# Patient Record
Sex: Male | Born: 1935
Health system: Southern US, Community
[De-identification: ages and names within clinical notes are randomized; demographics above are authoritative.]

## PROBLEM LIST (undated history)

## (undated) DIAGNOSIS — M1990 Unspecified osteoarthritis, unspecified site: Secondary | ICD-10-CM

## (undated) DIAGNOSIS — H3530 Unspecified macular degeneration: Secondary | ICD-10-CM

## (undated) DIAGNOSIS — I10 Essential (primary) hypertension: Secondary | ICD-10-CM

## (undated) DIAGNOSIS — C801 Malignant (primary) neoplasm, unspecified: Secondary | ICD-10-CM

## (undated) HISTORY — DX: Unspecified macular degeneration: H35.30

## (undated) HISTORY — PX: PROSTATE SURGERY: SHX751

## (undated) HISTORY — DX: Essential (primary) hypertension: I10

## (undated) HISTORY — DX: Malignant (primary) neoplasm, unspecified: C80.1

## (undated) HISTORY — DX: Unspecified osteoarthritis, unspecified site: M19.90

---

## 2011-06-16 DIAGNOSIS — C801 Malignant (primary) neoplasm, unspecified: Secondary | ICD-10-CM

## 2011-06-16 HISTORY — DX: Malignant (primary) neoplasm, unspecified: C80.1

## 2012-10-26 DIAGNOSIS — H353290 Exudative age-related macular degeneration, unspecified eye, stage unspecified: Secondary | ICD-10-CM | POA: Insufficient documentation

## 2013-08-09 DIAGNOSIS — C61 Malignant neoplasm of prostate: Secondary | ICD-10-CM | POA: Insufficient documentation

## 2014-07-04 ENCOUNTER — Other Ambulatory Visit: Payer: Self-pay | Admitting: *Deleted

## 2014-07-04 MED ORDER — CARVEDILOL 12.5 MG PO TABS
12.5000 mg | ORAL_TABLET | Freq: Two times a day (BID) | ORAL | Status: DC
Start: 2014-07-04 — End: 2014-09-24

## 2014-07-04 NOTE — Telephone Encounter (Signed)
Pt requesting refill on Carvedilol 12.5 mg, has appt scheduled with you 08/06/14, if approved will go electronically to [facility] in [employer] [employer].

## 2014-08-06 ENCOUNTER — Encounter: Payer: Self-pay | Admitting: Family Medicine

## 2014-09-04 ENCOUNTER — Ambulatory Visit (INDEPENDENT_AMBULATORY_CARE_PROVIDER_SITE_OTHER): Payer: Medicare Other | Admitting: Family Medicine

## 2014-09-04 ENCOUNTER — Encounter: Payer: Self-pay | Admitting: Family Medicine

## 2014-09-04 ENCOUNTER — Encounter (INDEPENDENT_AMBULATORY_CARE_PROVIDER_SITE_OTHER): Payer: Self-pay

## 2014-09-04 VITALS — BP 152/80 | HR 83 | Temp 97.2°F | Ht 66.5 in | Wt 183.8 lb

## 2014-09-04 DIAGNOSIS — E785 Hyperlipidemia, unspecified: Secondary | ICD-10-CM | POA: Diagnosis not present

## 2014-09-04 DIAGNOSIS — R5383 Other fatigue: Secondary | ICD-10-CM

## 2014-09-04 DIAGNOSIS — Z0000 Encounter for general adult medical examination without abnormal findings: Secondary | ICD-10-CM

## 2014-09-04 DIAGNOSIS — I10 Essential (primary) hypertension: Secondary | ICD-10-CM | POA: Diagnosis not present

## 2014-09-04 DIAGNOSIS — Z23 Encounter for immunization: Secondary | ICD-10-CM

## 2014-09-04 DIAGNOSIS — C61 Malignant neoplasm of prostate: Secondary | ICD-10-CM | POA: Diagnosis not present

## 2014-09-04 LAB — POCT CBC
Granulocyte percent: 72.4 %G (ref 37–80)
HCT, POC: 52 % (ref 43.5–53.7)
Hemoglobin: 16.5 g/dL (ref 14.1–18.1)
LYMPH, POC: 2.1 (ref 0.6–3.4)
MCH: 29.7 pg (ref 27–31.2)
MCHC: 31.8 g/dL (ref 31.8–35.4)
MCV: 93.6 fL (ref 80–97)
MPV: 7.6 fL (ref 0–99.8)
PLATELET COUNT, POC: 242 10*3/uL (ref 142–424)
POC GRANULOCYTE: 7 — AB (ref 2–6.9)
POC LYMPH %: 21.6 % (ref 10–50)
RBC: 5.56 M/uL (ref 4.69–6.13)
RDW, POC: 13.3 %
WBC: 9.6 10*3/uL (ref 4.6–10.2)

## 2014-09-04 MED ORDER — AMLODIPINE BESYLATE 5 MG PO TABS: 5.0000 mg | ORAL_TABLET | Freq: Every day | ORAL | Status: DC

## 2014-09-04 NOTE — Progress Notes (Signed)
Subjective:  Patient ID: Aaron Whitney, male    DOB: 1935/11/28  Age: 79 y.o. MRN: [id number]  CC: Annual Exam   HPI Aaron Whitney presents for CPE. Using 1/2 amlodipine. BP at home 128/74 this AM is typical.   follow-up of hypertension. Patient has no history of headache chest pain or shortness of breath or recent cough. Patient also denies symptoms of TIA such as numbness weakness lateralizing. Patient checks  blood pressure at home and has not had any elevated readings recently. Patient denies side effects from his medication.   Patient in for follow-up of elevated cholesterol. To date patient has declined medication. Denies side effects of statin he simply does not want to take them because of the possibility. He has tried various herbal preparations over the years.  He remains active walking and participating in multiple activities of moderate strenuous nature. He follows a careful diet with good balance of vegetables vitamins etc.  He is being followed by his urologist for prostate cancer but is not currently under treatment. They're simply monitoring his PSA twice annually.   History Aaron Whitney has a past medical history of Hypertension; Cancer (2013); and Macular degeneration.   He has no past surgical history on file.   His family history includes Alcohol abuse in his father; Cancer in his mother.He reports that he has never smoked. He does not have any smokeless tobacco history on file. He reports that he does not drink alcohol or use illicit drugs.  Current Outpatient Prescriptions on File Prior to Visit  Medication Sig Dispense Refill  . carvedilol (COREG) 12.5 MG tablet Take 1 tablet (12.5 mg total) by mouth 2 (two) times daily with a meal. 180 tablet 0   No current facility-administered medications on file prior to visit.    ROS Review of Systems  Constitutional: Negative for fever, chills, diaphoresis, activity change, appetite change, fatigue and  unexpected weight change.  HENT: Negative for congestion, ear pain, hearing loss, postnasal drip, rhinorrhea, sore throat, tinnitus and trouble swallowing.   Eyes: Negative for photophobia, pain, discharge and redness.  Respiratory: Negative for apnea, cough, choking, chest tightness, shortness of breath, wheezing and stridor.   Cardiovascular: Negative for chest pain, palpitations and leg swelling.  Gastrointestinal: Negative for nausea, vomiting, abdominal pain, diarrhea, constipation, blood in stool and abdominal distention.  Endocrine: Negative for cold intolerance, heat intolerance, polydipsia, polyphagia and polyuria.  Genitourinary: Negative for dysuria, urgency, frequency, hematuria, flank pain, enuresis, difficulty urinating and genital sores.  Musculoskeletal: Negative for joint swelling and arthralgias.  Skin: Negative for color change, rash and wound.  Allergic/Immunologic: Negative for immunocompromised state.  Neurological: Negative for dizziness, tremors, seizures, syncope, facial asymmetry, speech difficulty, weakness, light-headedness, numbness and headaches.  Hematological: Does not bruise/bleed easily.  Psychiatric/Behavioral: Negative for suicidal ideas, hallucinations, behavioral problems, confusion, sleep disturbance, dysphoric mood, decreased concentration and agitation. The patient is not nervous/anxious and is not hyperactive.     Objective:  BP 152/80 mmHg  Pulse 83  Temp(Src) 97.2 F (36.2 C) (Oral)  Ht 5' 6.5" (1.689 m)  Wt 183 lb 12.8 oz (83.371 kg)  BMI 29.23 kg/m2  Physical Exam  Constitutional: He is oriented to person, place, and time. He appears well-developed and well-nourished.  HENT:  Head: Normocephalic and atraumatic.  Mouth/Throat: Oropharynx is clear and moist.  Eyes: EOM are normal. Pupils are equal, round, and reactive to light.  Neck: Normal range of motion. No tracheal deviation present. No thyromegaly present.  Cardiovascular: Normal  rate,  regular rhythm and normal heart sounds.  Exam reveals no gallop and no friction rub.   No murmur heard. Pulmonary/Chest: Breath sounds normal. He has no wheezes. He has no rales.  Abdominal: Soft. He exhibits no mass. There is no tenderness.  Musculoskeletal: Normal range of motion. He exhibits no edema.  Neurological: He is alert and oriented to person, place, and time.  Skin: Skin is warm and dry.  Psychiatric: He has a normal mood and affect.    Assessment & Plan:   Aaron Whitney was seen today for annual exam.  Diagnoses and all orders for this visit:  Cancer of prostate with low recurrence risk (stage T1-2a and Gleason < 7 and PSA < 10) Orders: -     PSA, total and free -     Testosterone,Free and Total -     Fecal occult blood, imunochemical  Wellness examination Orders: -     POCT CBC -     CMP14+EGFR -     NMR, lipoprofile -     PSA, total and free -     Thyroid Panel With TSH -     Vit D  25 hydroxy (rtn osteoporosis monitoring) -     Fecal occult blood, imunochemical  Other fatigue Orders: -     POCT CBC -     Thyroid Panel With TSH -     Vit D  25 hydroxy (rtn osteoporosis monitoring)  Hyperlipemia Orders: -     NMR, lipoprofile  Essential hypertension Orders: -     POCT CBC -     CMP14+EGFR -     Fecal occult blood, imunochemical  Other orders -     amLODipine (NORVASC) 5 MG tablet; Take 1 tablet (5 mg total) by mouth daily. -     Pneumococcal conjugate vaccine 13-valent IM   I have changed Mr. Aaron Whitney's amLODipine. I am also having him maintain his carvedilol and hyoscyamine.  Meds ordered this encounter  Medications  . DISCONTD: amLODipine (NORVASC) 10 MG tablet    Sig: Take 5 mg by mouth daily.  . hyoscyamine (LEVSIN SL) 0.125 MG SL tablet    Sig: 1-2 under tongue every four hours as needed for abdominal cramps, bloating or pain  . amLODipine (NORVASC) 5 MG tablet    Sig: Take 1 tablet (5 mg total) by mouth daily.    Dispense:  90 tablet     Refill:  3     Follow-up: Return in about 6 months (around 03/07/2015).  Claretta Fraise, M.D.

## 2014-09-04 NOTE — Patient Instructions (Signed)
Continue meds as discussed

## 2014-09-06 LAB — THYROID PANEL WITH TSH
Free Thyroxine Index: 3 (ref 1.2–4.9)
T3 UPTAKE RATIO: 31 % (ref 24–39)
T4 TOTAL: 9.6 ug/dL (ref 4.5–12.0)
TSH: 2.17 u[IU]/mL (ref 0.450–4.500)

## 2014-09-06 LAB — PSA, TOTAL AND FREE
PSA FREE: 0.15 ng/mL
PSA, Free Pct: 15 %
PSA: 1 ng/mL (ref 0.0–4.0)

## 2014-09-06 LAB — CMP14+EGFR
ALT: 20 IU/L (ref 0–44)
AST: 26 IU/L (ref 0–40)
Albumin/Globulin Ratio: 1.8 (ref 1.1–2.5)
Albumin: 4.8 g/dL (ref 3.5–4.8)
Alkaline Phosphatase: 68 IU/L (ref 39–117)
BUN/Creatinine Ratio: 19 (ref 10–22)
BUN: 20 mg/dL (ref 8–27)
Bilirubin Total: 0.8 mg/dL (ref 0.0–1.2)
CHLORIDE: 96 mmol/L — AB (ref 97–108)
CO2: 24 mmol/L (ref 18–29)
Calcium: 10.1 mg/dL (ref 8.6–10.2)
Creatinine, Ser: 1.07 mg/dL (ref 0.76–1.27)
GFR calc non Af Amer: 67 mL/min/{1.73_m2} (ref >59)
GFR, EST AFRICAN AMERICAN: 78 mL/min/{1.73_m2} (ref >59)
GLUCOSE: 108 mg/dL — AB (ref 65–99)
Globulin, Total: 2.7 g/dL (ref 1.5–4.5)
Potassium: 4.9 mmol/L (ref 3.5–5.2)
Sodium: 139 mmol/L (ref 134–144)
TOTAL PROTEIN: 7.5 g/dL (ref 6.0–8.5)

## 2014-09-06 LAB — NMR, LIPOPROFILE
CHOLESTEROL: 223 mg/dL — AB (ref 100–199)
HDL Cholesterol by NMR: 49 mg/dL (ref >39)
HDL Particle Number: 28 umol/L — ABNORMAL LOW (ref >=30.5)
LDL Particle Number: 2222 nmol/L — ABNORMAL HIGH (ref <1000)
LDL SIZE: 20.5 nm (ref >20.5)
LDL-C: 159 mg/dL — AB (ref 0–99)
LP-IR SCORE: 25 (ref <=45)
Small LDL Particle Number: 954 nmol/L — ABNORMAL HIGH (ref <=527)
Triglycerides by NMR: 76 mg/dL (ref 0–149)

## 2014-09-06 LAB — TESTOSTERONE,FREE AND TOTAL
TESTOSTERONE FREE: 6.2 pg/mL — AB (ref 6.6–18.1)
TESTOSTERONE: 420 ng/dL (ref 348–1197)

## 2014-09-06 LAB — VITAMIN D 25 HYDROXY (VIT D DEFICIENCY, FRACTURES): Vit D, 25-Hydroxy: 35.7 ng/mL (ref 30.0–100.0)

## 2014-09-06 LAB — FECAL OCCULT BLOOD, IMMUNOCHEMICAL: FECAL OCCULT BLD: NEGATIVE

## 2014-09-09 ENCOUNTER — Encounter: Payer: Self-pay | Admitting: Family Medicine

## 2014-09-10 ENCOUNTER — Other Ambulatory Visit: Payer: Self-pay | Admitting: *Deleted

## 2014-09-10 MED ORDER — ATORVASTATIN CALCIUM 40 MG PO TABS: 40.0000 mg | ORAL_TABLET | Freq: Every day | ORAL | Status: DC

## 2014-09-24 ENCOUNTER — Other Ambulatory Visit: Payer: Self-pay | Admitting: Family Medicine

## 2014-12-21 ENCOUNTER — Other Ambulatory Visit: Payer: Self-pay | Admitting: Family Medicine

## 2015-01-02 ENCOUNTER — Encounter: Payer: Self-pay | Admitting: *Deleted

## 2015-03-06 ENCOUNTER — Ambulatory Visit (INDEPENDENT_AMBULATORY_CARE_PROVIDER_SITE_OTHER): Payer: Medicare Other

## 2015-03-06 ENCOUNTER — Ambulatory Visit (INDEPENDENT_AMBULATORY_CARE_PROVIDER_SITE_OTHER): Payer: Medicare Other | Admitting: Family Medicine

## 2015-03-06 ENCOUNTER — Encounter: Payer: Self-pay | Admitting: Family Medicine

## 2015-03-06 ENCOUNTER — Other Ambulatory Visit: Payer: Self-pay | Admitting: Family Medicine

## 2015-03-06 VITALS — BP 149/78 | HR 68 | Temp 97.5°F | Ht 66.5 in | Wt 187.0 lb

## 2015-03-06 DIAGNOSIS — M4120 Other idiopathic scoliosis, site unspecified: Secondary | ICD-10-CM | POA: Diagnosis not present

## 2015-03-06 DIAGNOSIS — M5441 Lumbago with sciatica, right side: Secondary | ICD-10-CM

## 2015-03-06 DIAGNOSIS — M47812 Spondylosis without myelopathy or radiculopathy, cervical region: Secondary | ICD-10-CM

## 2015-03-06 DIAGNOSIS — M4716 Other spondylosis with myelopathy, lumbar region: Secondary | ICD-10-CM | POA: Diagnosis not present

## 2015-03-06 DIAGNOSIS — R293 Abnormal posture: Secondary | ICD-10-CM | POA: Diagnosis not present

## 2015-03-06 DIAGNOSIS — E785 Hyperlipidemia, unspecified: Secondary | ICD-10-CM | POA: Diagnosis not present

## 2015-03-06 DIAGNOSIS — I10 Essential (primary) hypertension: Secondary | ICD-10-CM

## 2015-03-06 IMAGING — CR DG CHEST 2V
2 series · 2 of 2 positions shown · non-contrast
Comparison: None.

CLINICAL DATA: Pain from neck to lower back.  No known injury.

EXAM:
CHEST  2 VIEW

[view not recorded (1 of 2)]
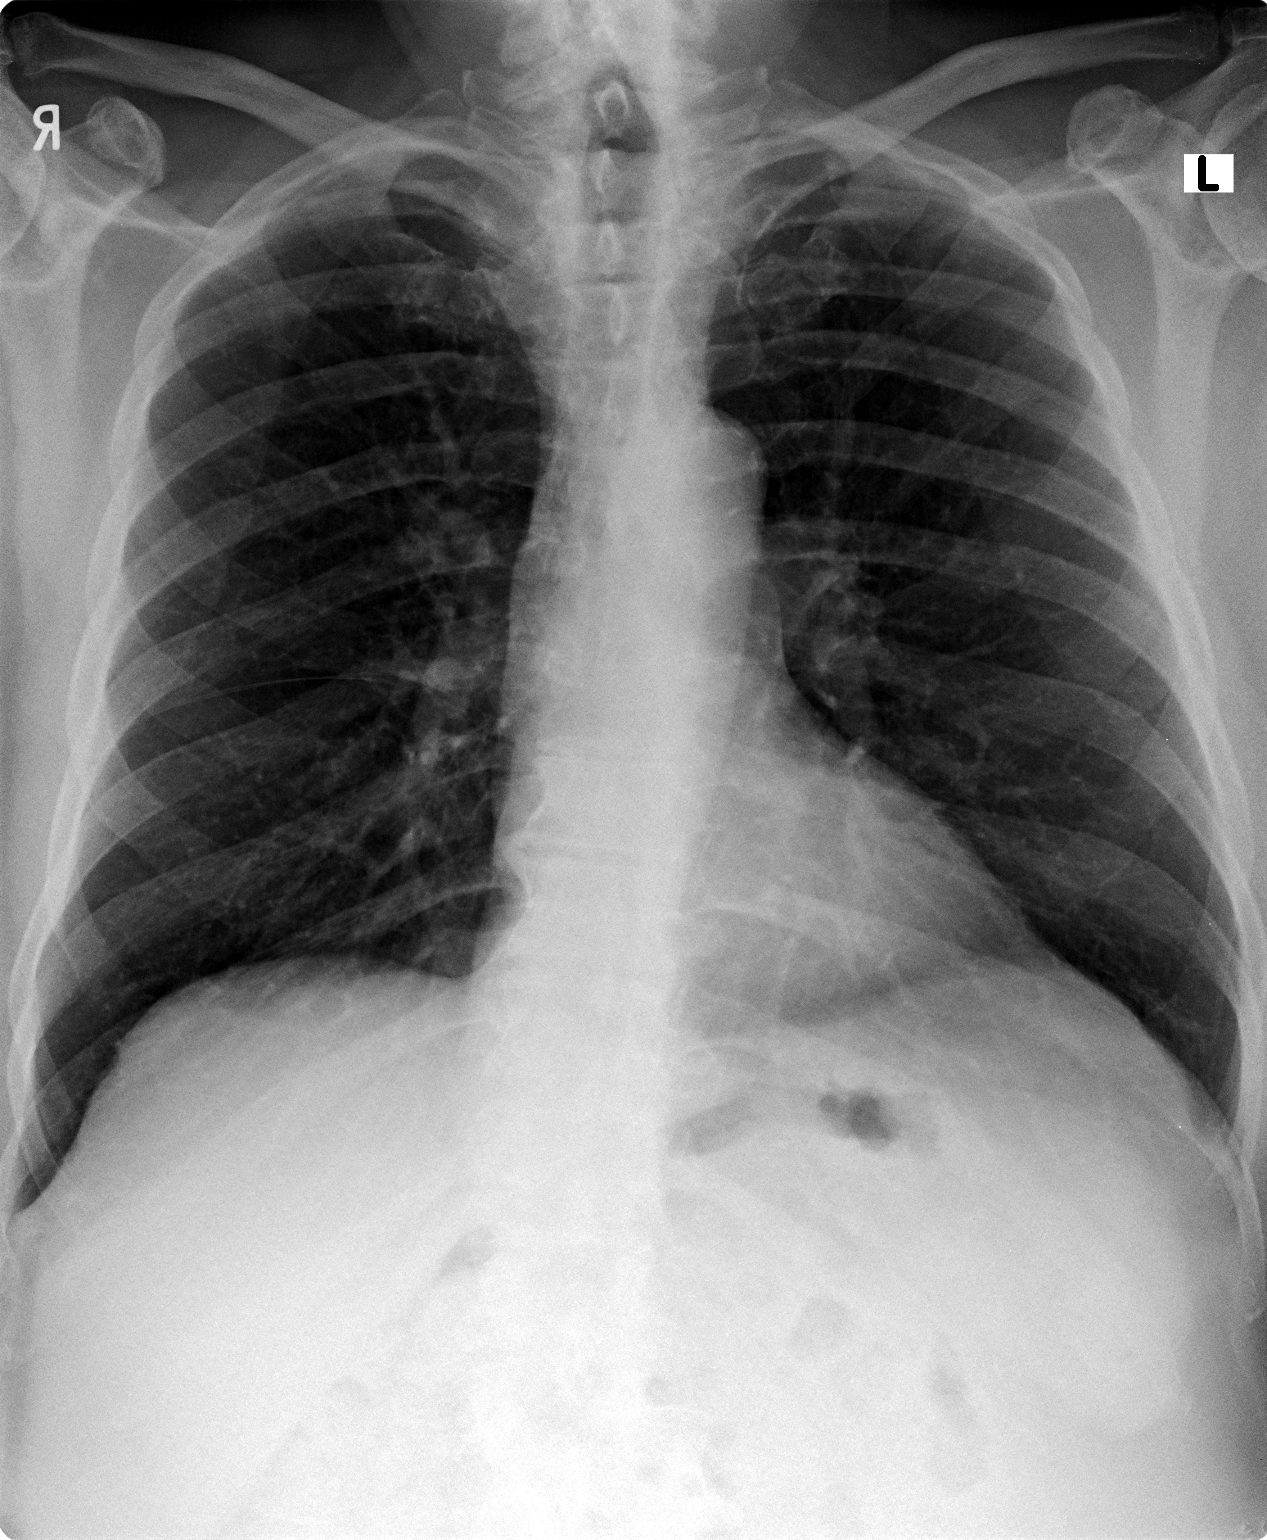

[view not recorded (2 of 2)]
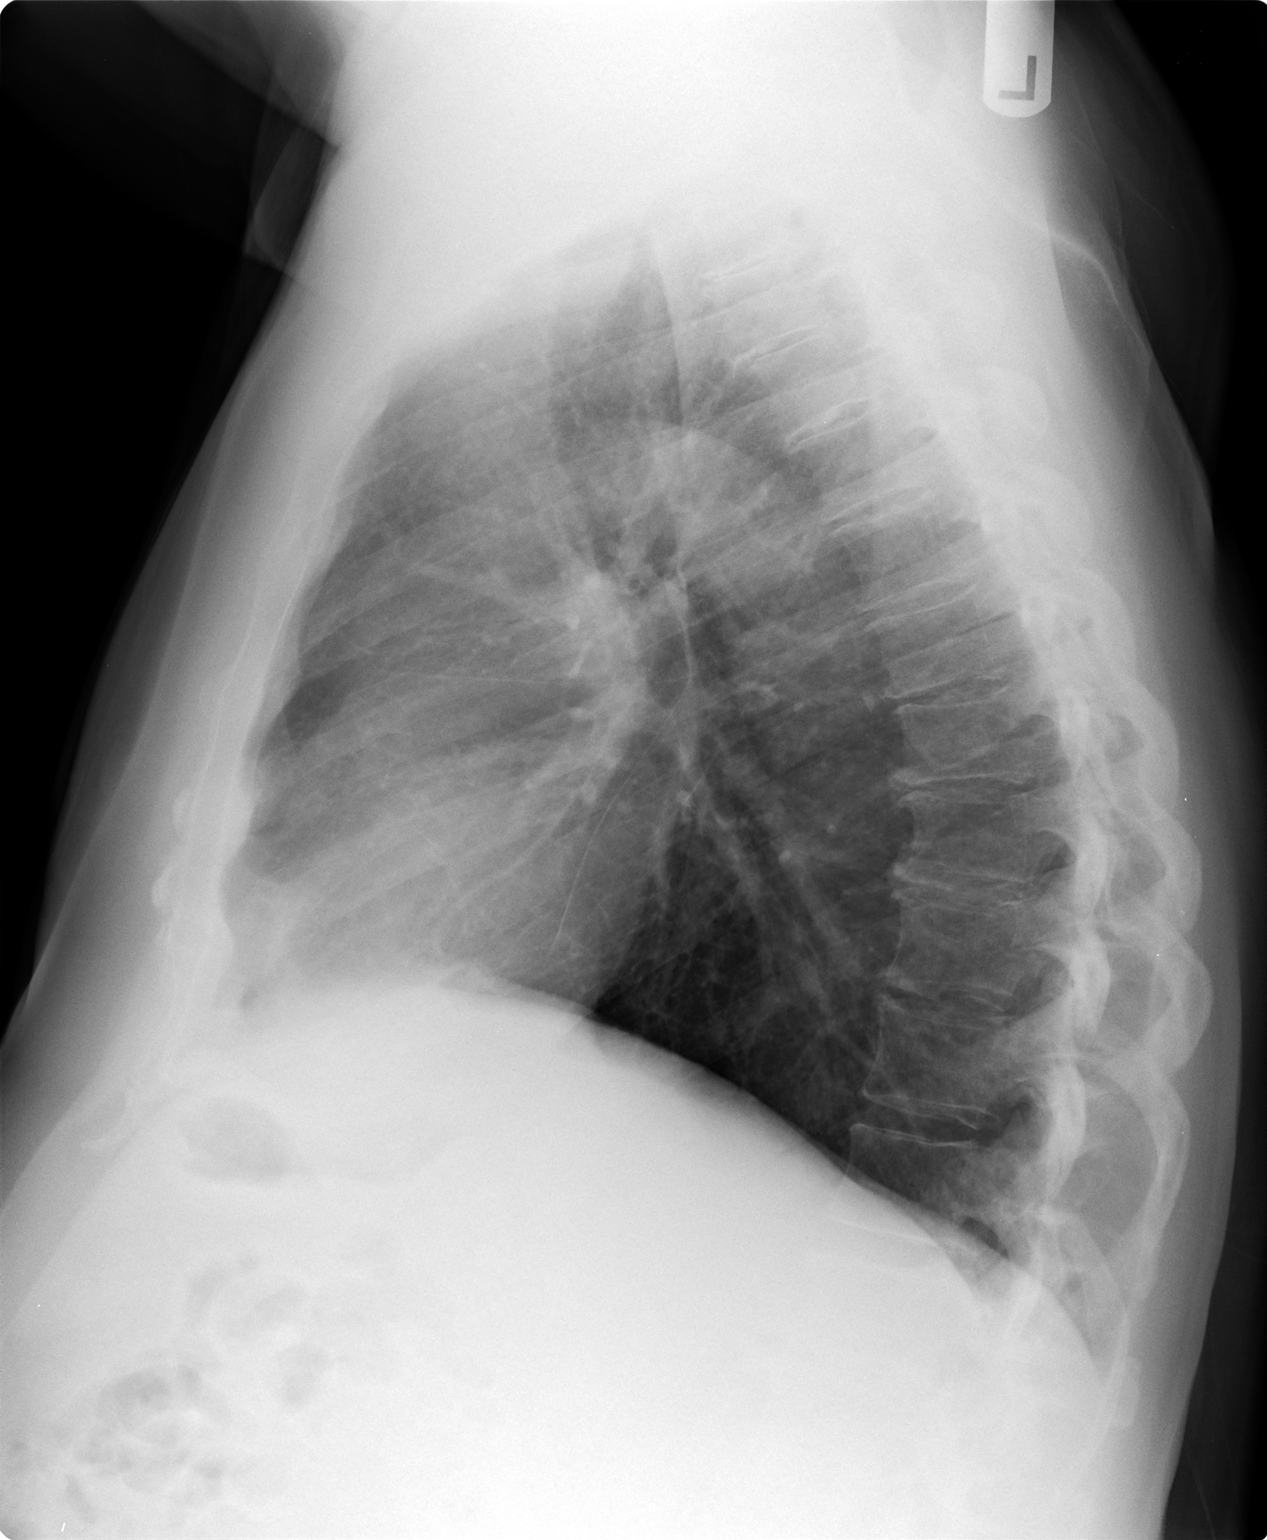

[2 of 2 positions shown; findings below may reference images not displayed]

FINDINGS: The heart size and mediastinal contours are within normal limits.
Both lungs are clear. The visualized skeletal structures are
unremarkable.
IMPRESSION: No active cardiopulmonary disease.

## 2015-03-06 MED ORDER — EZETIMIBE 10 MG PO TABS: 10.0000 mg | ORAL_TABLET | Freq: Every day | ORAL | Status: DC

## 2015-03-06 MED ORDER — CELECOXIB 200 MG PO CAPS: 200.0000 mg | ORAL_CAPSULE | Freq: Every day | ORAL | Status: DC

## 2015-03-06 MED ORDER — NIACIN ER 500 MG PO TBCR: 2000.0000 mg | EXTENDED_RELEASE_TABLET | Freq: Every day | ORAL | Status: DC

## 2015-03-06 MED ORDER — KRILL OIL OMEGA-3 500 MG PO CAPS: 2000.0000 mg | ORAL_CAPSULE | Freq: Every day | ORAL | Status: DC

## 2015-03-06 MED ORDER — LISINOPRIL 10 MG PO TABS: 10.0000 mg | ORAL_TABLET | Freq: Every day | ORAL | Status: DC

## 2015-03-06 NOTE — Progress Notes (Signed)
Subjective:  Patient ID: Aaron Whitney, male    DOB: 03/15/1936  Age: 79 y.o. MRN: [id number]  CC: Hyperlipidemia and Hypertension   HPI Aaron Whitney presents for  follow-up of hypertension. Some loss of balance. Patient has no history of headache chest pain or shortness of breath or recent cough. Patient also denies symptoms of TIA such as numbness weakness lateralizing. Patient checks  blood pressure at home and has not had any elevated readings recently. Usually 124/ 71. Patient denies side effects from his medication. States taking it regularly.  Patient also  in for follow-up of elevated cholesterol. Doing well without complaints on current medication. Denies side effects of statin including myalgia and arthralgia and nausea. Also in today for liver function testing. Currently no chest pain, shortness of breath or other cardiovascular related symptoms noted.  Lots of popping and cracking in upper chest without pain or dyspnea. Running down his spine.  Pain in RLE hip to shin. Worst at mid leg. Sporadic 5-15 pains several times daily. 7-8/10. INterfering with activity.   History Aaron Whitney has a past medical history of Hypertension; Cancer (2013); and Macular degeneration.   He has past surgical history that includes Prostate surgery.   His family history includes Alcohol abuse in his father; Cancer in his mother.He reports that he has never smoked. He does not have any smokeless tobacco history on file. He reports that he does not drink alcohol or use illicit drugs.  Current Outpatient Prescriptions on File Prior to Visit  Medication Sig Dispense Refill  . amLODipine (NORVASC) 5 MG tablet Take 1 tablet (5 mg total) by mouth daily. 90 tablet 3  . hyoscyamine (LEVSIN SL) 0.125 MG SL tablet 1-2 under tongue every four hours as needed for abdominal cramps, bloating or pain     No current facility-administered medications on file prior to visit.    ROS Review of  Systems  Constitutional: Negative for fever, chills and diaphoresis.  HENT: Negative for congestion, rhinorrhea and sore throat.   Respiratory: Negative for cough, shortness of breath and wheezing.   Cardiovascular: Negative for chest pain.  Gastrointestinal: Negative for nausea, vomiting, abdominal pain, diarrhea, constipation and abdominal distention.  Genitourinary: Negative for dysuria and frequency.  Musculoskeletal: Positive for myalgias, back pain, arthralgias (hip and leg on the right) and neck stiffness (in the morning). Negative for joint swelling.  Skin: Negative for rash.  Neurological: Negative for headaches.    Objective:  BP 149/78 mmHg  Pulse 68  Temp(Src) 97.5 F (36.4 C) (Oral)  Ht 5' 6.5" (1.689 m)  Wt 187 lb (84.823 kg)  BMI 29.73 kg/m2  BP Readings from Last 3 Encounters:  03/06/15 149/78  09/04/14 152/80    Wt Readings from Last 3 Encounters:  03/06/15 187 lb (84.823 kg)  09/04/14 183 lb 12.8 oz (83.371 kg)     Physical Exam  No results found for: HGBA1C  Lab Results  Component Value Date   WBC 9.6 09/04/2014   HGB 16.5 09/04/2014   HCT 52.0 09/04/2014   GLUCOSE 108* 09/04/2014   CHOL 223* 09/04/2014   TRIG 76 09/04/2014   HDL 49 09/04/2014   ALT 20 09/04/2014   AST 26 09/04/2014   NA 139 09/04/2014   K 4.9 09/04/2014   CL 96* 09/04/2014   CREATININE 1.07 09/04/2014   BUN 20 09/04/2014   CO2 24 09/04/2014   TSH 2.170 09/04/2014   PSA 1.0 09/04/2014    Patient was never admitted.  Assessment & Plan:   Aaron Whitney was seen today for hyperlipidemia and hypertension.  Diagnoses and all orders for this visit:  Hyperlipemia -     CMP14+EGFR -     Lipid panel  Essential hypertension -     CBC with Differential/Platelet -     CMP14+EGFR  Right-sided low back pain with sciatica, sciatica laterality unspecified -     DG Lumbar Spine 2-3 Views; Future -     DG Thoracic Spine 2 View; Future -     DG Cervical Spine Complete; Future -      DG Chest 2 View; Future -     DG HIP UNILAT WITH PELVIS MIN 4 VIEWS RIGHT; Future  Idiopathic scoliosis  Lumbar spondylosis with myelopathy  Cervical spondylosis without myelopathy  Other orders -     niacin (SLO-NIACIN) 500 MG tablet; Take 4 tablets (2,000 mg total) by mouth at bedtime. -     Krill Oil Omega-3 500 MG CAPS; Take 2,000 mg by mouth at bedtime. -     ezetimibe (ZETIA) 10 MG tablet; Take 1 tablet (10 mg total) by mouth daily. For cholesterol -     celecoxib (CELEBREX) 200 MG capsule; Take 1 capsule (200 mg total) by mouth daily. With food -     lisinopril (PRINIVIL,ZESTRIL) 10 MG tablet; Take 1 tablet (10 mg total) by mouth daily.   I have discontinued Mr. Pitcock atorvastatin and carvedilol. I am also having him start on niacin, Krill Oil Omega-3, ezetimibe, celecoxib, and lisinopril. Additionally, I am having him maintain his hyoscyamine and amLODipine.  Meds ordered this encounter  Medications  . niacin (SLO-NIACIN) 500 MG tablet    Sig: Take 4 tablets (2,000 mg total) by mouth at bedtime.    Dispense:  120 tablet    Refill:  11  . Krill Oil Omega-3 500 MG CAPS    Sig: Take 2,000 mg by mouth at bedtime.    Dispense:  120 capsule    Refill:  11  . ezetimibe (ZETIA) 10 MG tablet    Sig: Take 1 tablet (10 mg total) by mouth daily. For cholesterol    Dispense:  90 tablet    Refill:  3  . celecoxib (CELEBREX) 200 MG capsule    Sig: Take 1 capsule (200 mg total) by mouth daily. With food    Dispense:  30 capsule    Refill:  5  . lisinopril (PRINIVIL,ZESTRIL) 10 MG tablet    Sig: Take 1 tablet (10 mg total) by mouth daily.    Dispense:  90 tablet    Refill:  3   Patient's x-rays revealed severe spondylosis of the lumbar spine as well as moderate arthritis of the right hip Due to patient's concern for some loss of balance his beta blocker will be discontinued and he should start on lisinopril for blood pressure control. Follow-up: Return in about 3 months  (around 06/05/2015).  Claretta Fraise, M.D.

## 2015-03-06 NOTE — Patient Instructions (Signed)
Consider Osteo biflex for arthritis. Tylenol is also useful. These are compatible with celebrex.

## 2015-03-07 ENCOUNTER — Ambulatory Visit: Payer: Medicare Other | Admitting: Family Medicine

## 2015-03-07 ENCOUNTER — Telehealth: Payer: Self-pay | Admitting: Family Medicine

## 2015-03-07 LAB — CBC WITH DIFFERENTIAL/PLATELET
Basophils Absolute: 0 10*3/uL (ref 0.0–0.2)
Basos: 0 %
EOS (ABSOLUTE): 0.3 10*3/uL (ref 0.0–0.4)
EOS: 4 %
HEMATOCRIT: 45.6 % (ref 37.5–51.0)
HEMOGLOBIN: 16 g/dL (ref 12.6–17.7)
Immature Grans (Abs): 0 10*3/uL (ref 0.0–0.1)
Immature Granulocytes: 0 %
LYMPHS ABS: 1.8 10*3/uL (ref 0.7–3.1)
Lymphs: 23 %
MCH: 31.9 pg (ref 26.6–33.0)
MCHC: 35.1 g/dL (ref 31.5–35.7)
MCV: 91 fL (ref 79–97)
Monocytes Absolute: 0.7 10*3/uL (ref 0.1–0.9)
Monocytes: 9 %
NEUTROS ABS: 4.8 10*3/uL (ref 1.4–7.0)
Neutrophils: 64 %
Platelets: 168 10*3/uL (ref 150–379)
RBC: 5.02 x10E6/uL (ref 4.14–5.80)
RDW: 13.8 % (ref 12.3–15.4)
WBC: 7.5 10*3/uL (ref 3.4–10.8)

## 2015-03-07 LAB — CMP14+EGFR
ALBUMIN: 4.4 g/dL (ref 3.5–4.8)
ALK PHOS: 56 IU/L (ref 39–117)
ALT: 19 IU/L (ref 0–44)
AST: 25 IU/L (ref 0–40)
Albumin/Globulin Ratio: 1.8 (ref 1.1–2.5)
BUN / CREAT RATIO: 17 (ref 10–22)
BUN: 18 mg/dL (ref 8–27)
Bilirubin Total: 0.7 mg/dL (ref 0.0–1.2)
CO2: 24 mmol/L (ref 18–29)
CREATININE: 1.06 mg/dL (ref 0.76–1.27)
Calcium: 9.9 mg/dL (ref 8.6–10.2)
Chloride: 97 mmol/L (ref 97–108)
GFR calc Af Amer: 78 mL/min/{1.73_m2} (ref >59)
GFR, EST NON AFRICAN AMERICAN: 67 mL/min/{1.73_m2} (ref >59)
GLOBULIN, TOTAL: 2.4 g/dL (ref 1.5–4.5)
Glucose: 108 mg/dL — ABNORMAL HIGH (ref 65–99)
Potassium: 4.5 mmol/L (ref 3.5–5.2)
SODIUM: 139 mmol/L (ref 134–144)
Total Protein: 6.8 g/dL (ref 6.0–8.5)

## 2015-03-07 LAB — LIPID PANEL
CHOL/HDL RATIO: 4 ratio (ref 0.0–5.0)
CHOLESTEROL TOTAL: 190 mg/dL (ref 100–199)
HDL: 48 mg/dL (ref >39)
LDL CALC: 129 mg/dL — AB (ref 0–99)
Triglycerides: 63 mg/dL (ref 0–149)
VLDL Cholesterol Cal: 13 mg/dL (ref 5–40)

## 2015-03-08 NOTE — Telephone Encounter (Signed)
Patient called and verbalized understanding of the instructions given.

## 2015-03-08 NOTE — Telephone Encounter (Signed)
Pt needs instructions for tapering off of Coreg and starting Lisinopril Please advise

## 2015-03-08 NOTE — Telephone Encounter (Signed)
Use Coreg one half tablet in the morning and 1 tablet in the evening for 3 days. Then use one half twice daily for 3 days. Then use 0 in the morning and one half in the evening for 3 days. Then discontinue the medication.

## 2015-05-01 ENCOUNTER — Ambulatory Visit (INDEPENDENT_AMBULATORY_CARE_PROVIDER_SITE_OTHER): Payer: Medicare Other | Admitting: Family Medicine

## 2015-05-01 ENCOUNTER — Telehealth: Payer: Self-pay | Admitting: Family Medicine

## 2015-05-01 ENCOUNTER — Ambulatory Visit: Payer: Medicare Other | Admitting: Family Medicine

## 2015-05-01 VITALS — BP 144/86 | HR 119 | Temp 97.3°F | Ht 66.5 in | Wt 187.0 lb

## 2015-05-01 DIAGNOSIS — R000 Tachycardia, unspecified: Secondary | ICD-10-CM | POA: Diagnosis not present

## 2015-05-01 LAB — POCT URINALYSIS DIPSTICK
BILIRUBIN UA: NEGATIVE
Blood, UA: NEGATIVE
Glucose, UA: NEGATIVE
KETONES UA: NEGATIVE
LEUKOCYTES UA: NEGATIVE
Nitrite, UA: NEGATIVE
PH UA: 6
Protein, UA: NEGATIVE
Spec Grav, UA: 1.015
Urobilinogen, UA: NEGATIVE

## 2015-05-01 LAB — POCT UA - MICROSCOPIC ONLY
BACTERIA, U MICROSCOPIC: NEGATIVE
CASTS, UR, LPF, POC: NEGATIVE
CRYSTALS, UR, HPF, POC: NEGATIVE
Epithelial cells, urine per micros: NEGATIVE
RBC, urine, microscopic: NEGATIVE
Yeast, UA: NEGATIVE

## 2015-05-01 MED ORDER — CARVEDILOL 6.25 MG PO TABS
6.2500 mg | ORAL_TABLET | Freq: Two times a day (BID) | ORAL | Status: DC
Start: 2015-05-01 — End: 2015-08-22

## 2015-05-01 NOTE — Telephone Encounter (Signed)
Patient called and states his heart rate was 112. Patient states that he is not having any chest pain or shortness of breathe. Patient advised that if he starts having any chest pain, or sob  he needs to call 911. Patient advised understanding and appointment given for 12:55 today.

## 2015-05-01 NOTE — Progress Notes (Signed)
Subjective:  Patient ID: Aaron Whitney, male    DOB: 1935-12-30  Age: 79 y.o. MRN: [id number]  CC: Tachycardia   HPI Aaron Whitney presents for sudden onset of rapid heart rate earlier today. The patient felt weak but did not have any chest pain or shortness of breath. Symptoms lasted for approximately 1 hour. They then resolved and recurred again lasting another half hour. Currently he is asymptomatic. Of note is that he had taken carvedilol in the past. He had been tapered off of that medicine recently in order to simplify his medical regimen. He had been asymptomatic until today with that change.  History Aaron Whitney has a past medical history of Hypertension; Cancer (2013); and Macular degeneration.   He has past surgical history that includes Prostate surgery.   His family history includes Alcohol abuse in his father; Cancer in his mother.He reports that he has never smoked. He does not have any smokeless tobacco history on file. He reports that he does not drink alcohol or use illicit drugs.  Outpatient Prescriptions Prior to Visit  Medication Sig Dispense Refill  . amLODipine (NORVASC) 5 MG tablet Take 1 tablet (5 mg total) by mouth daily. 90 tablet 3  . celecoxib (CELEBREX) 200 MG capsule Take 1 capsule (200 mg total) by mouth daily. With food 30 capsule 5  . ezetimibe (ZETIA) 10 MG tablet Take 1 tablet (10 mg total) by mouth daily. For cholesterol 90 tablet 3  . hyoscyamine (LEVSIN SL) 0.125 MG SL tablet 1-2 under tongue every four hours as needed for abdominal cramps, bloating or pain    . Krill Oil Omega-3 500 MG CAPS Take 2,000 mg by mouth at bedtime. 120 capsule 11  . lisinopril (PRINIVIL,ZESTRIL) 10 MG tablet Take 1 tablet (10 mg total) by mouth daily. 90 tablet 3  . niacin (SLO-NIACIN) 500 MG tablet Take 4 tablets (2,000 mg total) by mouth at bedtime. 120 tablet 11   No facility-administered medications prior to visit.    ROS Review of Systems    Constitutional: Negative for fever, chills and diaphoresis.  HENT: Negative for congestion, rhinorrhea and sore throat.   Respiratory: Negative for cough, shortness of breath and wheezing.   Cardiovascular: Positive for palpitations. Negative for chest pain and leg swelling.  Gastrointestinal: Negative for nausea, vomiting, abdominal pain, diarrhea, constipation and abdominal distention.  Genitourinary: Negative for dysuria and frequency.  Musculoskeletal: Negative for joint swelling and arthralgias.  Skin: Negative for rash.  Neurological: Negative for headaches.    Objective:  BP 144/86 mmHg  Pulse 119  Temp(Src) 97.3 F (36.3 C) (Oral)  Ht 5' 6.5" (1.689 m)  Wt 187 lb (84.823 kg)  BMI 29.73 kg/m2  SpO2 97%  BP Readings from Last 3 Encounters:  05/01/15 144/86  03/06/15 149/78  09/04/14 152/80    Wt Readings from Last 3 Encounters:  05/01/15 187 lb (84.823 kg)  03/06/15 187 lb (84.823 kg)  09/04/14 183 lb 12.8 oz (83.371 kg)     Physical Exam  Constitutional: He is oriented to person, place, and time. He appears well-developed and well-nourished. No distress.  HENT:  Head: Normocephalic and atraumatic.  Right Ear: External ear normal.  Left Ear: External ear normal.  Nose: Nose normal.  Mouth/Throat: Oropharynx is clear and moist.  Eyes: Conjunctivae and EOM are normal. Pupils are equal, round, and reactive to light.  Neck: Normal range of motion. Neck supple. No thyromegaly present.  Cardiovascular: Regular rhythm and normal heart sounds.  No murmur heard. Heart regular rapid at 1 20 bpm on auscultation.  Pulmonary/Chest: Effort normal and breath sounds normal. No respiratory distress. He has no wheezes. He has no rales.  Abdominal: Soft. Bowel sounds are normal. He exhibits no distension. There is no tenderness.  Lymphadenopathy:    He has no cervical adenopathy.  Neurological: He is alert and oriented to person, place, and time. He has normal reflexes.   Skin: Skin is warm and dry.  Psychiatric: He has a normal mood and affect. His behavior is normal. Judgment and thought content normal.    No results found for: HGBA1C  Lab Results  Component Value Date   WBC 7.5 03/06/2015   HGB 16.5 09/04/2014   HCT 45.6 03/06/2015   GLUCOSE 108* 03/06/2015   CHOL 190 03/06/2015   TRIG 63 03/06/2015   HDL 48 03/06/2015   LDLCALC 129* 03/06/2015   ALT 19 03/06/2015   AST 25 03/06/2015   NA 139 03/06/2015   K 4.5 03/06/2015   CL 97 03/06/2015   CREATININE 1.06 03/06/2015   BUN 18 03/06/2015   CO2 24 03/06/2015   TSH 2.170 09/04/2014   PSA 1.0 09/04/2014    Patient was never admitted.  Assessment & Plan:   Aaron Whitney was seen today for tachycardia.  Diagnoses and all orders for this visit:  Tachycardia -     EKG 12-Lead -     POCT urinalysis dipstick -     POCT UA - Microscopic Only  Other orders -     carvedilol (COREG) 6.25 MG tablet; Take 1 tablet (6.25 mg total) by mouth 2 (two) times daily with a meal. For blood pressure/heart/kidney   I am having Aaron Whitney start on carvedilol. I am also having him maintain his hyoscyamine, amLODipine, niacin, Krill Oil Omega-3, ezetimibe, celecoxib, and lisinopril.  Meds ordered this encounter  Medications  . carvedilol (COREG) 6.25 MG tablet    Sig: Take 1 tablet (6.25 mg total) by mouth 2 (two) times daily with a meal. For blood pressure/heart/kidney    Dispense:  60 tablet    Refill:  3   Take carvedilol 1/2 tablet twice a day for three days. If there is no increase in dizziness go ahead and increase to a whole tablet twice a day. Notify me if dizziness or heart rate are excessive.  Happy Thanksgiving!  Follow-up: Return in about 2 weeks (around 05/15/2015).  Claretta Fraise, M.D.

## 2015-05-01 NOTE — Patient Instructions (Signed)
Take carvedilol 1/2 tablet twice a day for three days. If there is no increase in dizziness go ahead and increase to a whole tablet twice a day. Notify me if dizziness or heart rate are excessive.  Happy Thanksgiving!

## 2015-05-08 ENCOUNTER — Encounter: Payer: Self-pay | Admitting: Family Medicine

## 2015-05-15 ENCOUNTER — Ambulatory Visit (INDEPENDENT_AMBULATORY_CARE_PROVIDER_SITE_OTHER): Payer: Medicare Other | Admitting: Family Medicine

## 2015-05-15 ENCOUNTER — Encounter: Payer: Self-pay | Admitting: Family Medicine

## 2015-05-15 VITALS — BP 142/80 | HR 74 | Temp 97.4°F | Ht 66.5 in | Wt 183.0 lb

## 2015-05-15 DIAGNOSIS — R002 Palpitations: Secondary | ICD-10-CM

## 2015-05-15 DIAGNOSIS — R000 Tachycardia, unspecified: Secondary | ICD-10-CM | POA: Diagnosis not present

## 2015-05-15 DIAGNOSIS — M47812 Spondylosis without myelopathy or radiculopathy, cervical region: Secondary | ICD-10-CM

## 2015-05-15 DIAGNOSIS — M4716 Other spondylosis with myelopathy, lumbar region: Secondary | ICD-10-CM | POA: Diagnosis not present

## 2015-05-15 NOTE — Progress Notes (Signed)
Subjective:  Patient ID: Aaron Whitney, male    DOB: 04-Aug-1935  Age: 78 y.o. MRN: [id number]  CC: Tachycardia   HPI Aaron Whitney presents for recheck of sudden onset of rapid heart rate. The patient felt weak but did not have any chest pain or shortness of breath. Symptoms lasted for approximately 1 hour. They then resolved and recurred again lasting another half hour. He resumed taking his carvedilol and has had no further symptoms. He denies dizziness from the med. He titrated as we discussed. He now states that he did have some vague occasional chest pain leading up to this. No recurrence since starting on the carvedilol.  History Aaron Whitney has a past medical history of Hypertension; Cancer ([place]) (2013); and Macular degeneration.   He has past surgical history that includes Prostate surgery.   His family history includes Alcohol abuse in his father; Cancer in his mother.He reports that he has never smoked. He does not have any smokeless tobacco history on file. He reports that he does not drink alcohol or use illicit drugs.  Outpatient Prescriptions Prior to Visit  Medication Sig Dispense Refill  . amLODipine (NORVASC) 5 MG tablet Take 1 tablet (5 mg total) by mouth daily. 90 tablet 3  . carvedilol (COREG) 6.25 MG tablet Take 1 tablet (6.25 mg total) by mouth 2 (two) times daily with a meal. For blood pressure/heart/kidney 60 tablet 3  . hyoscyamine (LEVSIN SL) 0.125 MG SL tablet 1-2 under tongue every four hours as needed for abdominal cramps, bloating or pain    . Krill Oil Omega-3 500 MG CAPS Take 2,000 mg by mouth at bedtime. 120 capsule 11  . lisinopril (PRINIVIL,ZESTRIL) 10 MG tablet Take 1 tablet (10 mg total) by mouth daily. 90 tablet 3  . niacin (SLO-NIACIN) 500 MG tablet Take 4 tablets (2,000 mg total) by mouth at bedtime. 120 tablet 11  . celecoxib (CELEBREX) 200 MG capsule Take 1 capsule (200 mg total) by mouth daily. With food (Patient not taking: Reported  on 05/15/2015) 30 capsule 5  . ezetimibe (ZETIA) 10 MG tablet Take 1 tablet (10 mg total) by mouth daily. For cholesterol (Patient not taking: Reported on 05/15/2015) 90 tablet 3   No facility-administered medications prior to visit.    ROS Review of Systems  Constitutional: Negative for fever, chills and diaphoresis.  HENT: Negative for congestion, rhinorrhea and sore throat.   Respiratory: Negative for cough, shortness of breath and wheezing.   Cardiovascular: Positive for chest pain and palpitations. Negative for leg swelling.  Gastrointestinal: Negative for nausea, vomiting, abdominal pain, diarrhea, constipation and abdominal distention.  Genitourinary: Negative for dysuria and frequency.  Musculoskeletal: Positive for myalgias, joint swelling and arthralgias (eck and lower back flaring recently. He is considering trying the Celebrex but would like to try something natural.).  Skin: Negative for rash.  Neurological: Negative for headaches.    Objective:  BP 142/80 mmHg  Pulse 74  Temp(Src) 97.4 F (36.3 C) (Oral)  Ht 5' 6.5" (1.689 m)  Wt 183 lb (83.008 kg)  BMI 29.10 kg/m2  BP Readings from Last 3 Encounters:  05/15/15 142/80  05/01/15 144/86  03/06/15 149/78    Wt Readings from Last 3 Encounters:  05/15/15 183 lb (83.008 kg)  05/01/15 187 lb (84.823 kg)  03/06/15 187 lb (84.823 kg)     Physical Exam  Constitutional: He is oriented to person, place, and time. He appears well-developed and well-nourished. No distress.  HENT:  Head: Normocephalic and  atraumatic.  Right Ear: External ear normal.  Left Ear: External ear normal.  Nose: Nose normal.  Mouth/Throat: Oropharynx is clear and moist.  Eyes: Conjunctivae and EOM are normal. Pupils are equal, round, and reactive to light.  Neck: Normal range of motion. Neck supple. No thyromegaly present.  Cardiovascular: Regular rhythm and normal heart sounds.   No murmur heard. Heart regular rapid at 1 20 bpm on  auscultation.  Pulmonary/Chest: Effort normal and breath sounds normal. No respiratory distress. He has no wheezes. He has no rales.  Abdominal: Soft. Bowel sounds are normal. He exhibits no distension. There is no tenderness.  Lymphadenopathy:    He has no cervical adenopathy.  Neurological: He is alert and oriented to person, place, and time. He has normal reflexes.  Skin: Skin is warm and dry.  Psychiatric: He has a normal mood and affect. His behavior is normal. Judgment and thought content normal.    No results found for: HGBA1C  Lab Results  Component Value Date   WBC 7.5 03/06/2015   HGB 16.5 09/04/2014   HCT 45.6 03/06/2015   GLUCOSE 108* 03/06/2015   CHOL 190 03/06/2015   TRIG 63 03/06/2015   HDL 48 03/06/2015   LDLCALC 129* 03/06/2015   ALT 19 03/06/2015   AST 25 03/06/2015   NA 139 03/06/2015   K 4.5 03/06/2015   CL 97 03/06/2015   CREATININE 1.06 03/06/2015   BUN 18 03/06/2015   CO2 24 03/06/2015   TSH 2.170 09/04/2014   PSA 1.0 09/04/2014    Patient was never admitted.  Assessment & Plan:   Aaron Whitney was seen today for tachycardia.  Diagnoses and all orders for this visit:  Palpitations -     Myocardial Perfusion Imaging; Future  Tachycardia  Lumbar spondylosis with myelopathy  Cervical spondylosis without myelopathy  I am having Aaron Whitney maintain his hyoscyamine, amLODipine, niacin, Krill Oil Omega-3, ezetimibe, celecoxib, lisinopril, and carvedilol.  No orders of the defined types were placed in this encounter.   Continue meds as is. Cardiac stress test to be arranged. Try Osteo Bi-Flex if that is not adequate he may need to go ahead with the Celebrex for your arthritic pain.  Follow-up: Return in about 4 months (around 09/12/2015) for CPE.  Claretta Fraise, M.D.

## 2015-05-31 ENCOUNTER — Ambulatory Visit: Payer: Medicare Other | Admitting: Family Medicine

## 2015-08-22 ENCOUNTER — Other Ambulatory Visit: Payer: Self-pay | Admitting: Family Medicine

## 2015-09-03 ENCOUNTER — Other Ambulatory Visit: Payer: Self-pay | Admitting: Family Medicine

## 2015-09-18 ENCOUNTER — Ambulatory Visit (INDEPENDENT_AMBULATORY_CARE_PROVIDER_SITE_OTHER): Payer: Medicare Other | Admitting: Family Medicine

## 2015-09-18 ENCOUNTER — Encounter: Payer: Self-pay | Admitting: Family Medicine

## 2015-09-18 VITALS — BP 119/73 | HR 68 | Temp 97.2°F | Ht 67.0 in | Wt 182.0 lb

## 2015-09-18 DIAGNOSIS — M150 Primary generalized (osteo)arthritis: Secondary | ICD-10-CM

## 2015-09-18 DIAGNOSIS — M4716 Other spondylosis with myelopathy, lumbar region: Secondary | ICD-10-CM

## 2015-09-18 DIAGNOSIS — Z0000 Encounter for general adult medical examination without abnormal findings: Secondary | ICD-10-CM | POA: Diagnosis not present

## 2015-09-18 DIAGNOSIS — M47812 Spondylosis without myelopathy or radiculopathy, cervical region: Secondary | ICD-10-CM

## 2015-09-18 DIAGNOSIS — E785 Hyperlipidemia, unspecified: Secondary | ICD-10-CM

## 2015-09-18 DIAGNOSIS — M1990 Unspecified osteoarthritis, unspecified site: Secondary | ICD-10-CM | POA: Insufficient documentation

## 2015-09-18 DIAGNOSIS — C61 Malignant neoplasm of prostate: Secondary | ICD-10-CM

## 2015-09-18 DIAGNOSIS — I10 Essential (primary) hypertension: Secondary | ICD-10-CM

## 2015-09-18 DIAGNOSIS — Z139 Encounter for screening, unspecified: Secondary | ICD-10-CM

## 2015-09-18 DIAGNOSIS — E559 Vitamin D deficiency, unspecified: Secondary | ICD-10-CM

## 2015-09-18 DIAGNOSIS — M159 Polyosteoarthritis, unspecified: Secondary | ICD-10-CM

## 2015-09-18 MED ORDER — CARVEDILOL 6.25 MG PO TABS: 6.2500 mg | ORAL_TABLET | Freq: Two times a day (BID) | ORAL | Status: DC

## 2015-09-18 NOTE — Progress Notes (Signed)
Subjective:   Aaron Whitney is a 80 y.o. male who presents for an Initial Medicare Annual Wellness Visit.  Arthritis pain Is her several pain involves the posterior neck knees shoulders and back. Primarily he feels a popping and cracking but the pain is manageable with the Osteo Bi-Flex that he is using regularly. Activities are not limited by the arthritis. BP usually around [id number] sytolic. Checking twice daily.  Review of Systems  Review of Systems  Constitutional: Positive for malaise/fatigue. Negative for fever, chills, weight loss and diaphoresis.  HENT: Negative for congestion, ear pain, hearing loss, nosebleeds, sore throat and tinnitus.   Eyes: Negative for blurred vision, double vision, photophobia, pain, discharge and redness.  Respiratory: Positive for shortness of breath and wheezing. Negative for cough, hemoptysis and sputum production.   Cardiovascular: Negative for chest pain, palpitations, orthopnea, leg swelling and PND.  Gastrointestinal: Positive for heartburn. Negative for nausea, vomiting, abdominal pain, diarrhea, constipation, blood in stool and melena.  Genitourinary: Negative for dysuria, urgency, frequency, hematuria and flank pain.  Musculoskeletal: Positive for myalgias, back pain and joint pain. Negative for falls and neck pain.  Skin: Negative for itching and rash.  Neurological: Negative for dizziness, tingling, tremors, sensory change, speech change, focal weakness, seizures, loss of consciousness, weakness and headaches.  Endo/Heme/Allergies: Negative for environmental allergies and polydipsia. Does not bruise/bleed easily.  Psychiatric/Behavioral: Negative for depression, suicidal ideas, hallucinations, memory loss and substance abuse. The patient is not nervous/anxious and does not have insomnia.         Current Medications (verified) Outpatient Encounter Prescriptions as of 09/18/2015  Medication Sig  . amLODipine (NORVASC) 5 MG tablet TAKE 1  TABLET EVERY DAY  . carvedilol (COREG) 6.25 MG tablet Take 1 tablet (6.25 mg total) by mouth 2 (two) times daily with a meal.  . hyoscyamine (LEVSIN SL) 0.125 MG SL tablet 1-2 under tongue every four hours as needed for abdominal cramps, bloating or pain  . Krill Oil Omega-3 500 MG CAPS Take 2,000 mg by mouth at bedtime.  Marland Kitchen lisinopril (PRINIVIL,ZESTRIL) 10 MG tablet Take 1 tablet (10 mg total) by mouth daily.  . Misc Natural Products (OSTEO BI-FLEX JOINT SHIELD PO) Take 1 tablet by mouth daily.  . niacin (SLO-NIACIN) 500 MG tablet Take 4 tablets (2,000 mg total) by mouth at bedtime.  . [DISCONTINUED] carvedilol (COREG) 6.25 MG tablet TAKE 1 TABLET TWICE A DAYWITH A MEAL FOR BLOOD PRESSURE/HEART/KIDNEY  . celecoxib (CELEBREX) 200 MG capsule Take 1 capsule (200 mg total) by mouth daily. With food (Patient not taking: Reported on 05/15/2015)  . ezetimibe (ZETIA) 10 MG tablet Take 1 tablet (10 mg total) by mouth daily. For cholesterol (Patient not taking: Reported on 09/18/2015)   No facility-administered encounter medications on file as of 09/18/2015.    Allergies (verified) Review of patient's allergies indicates no known allergies.   History: Past Medical History  Diagnosis Date  . Hypertension   . Cancer [facility]) 2013    prostate  . Macular degeneration    Past Surgical History  Procedure Laterality Date  . Prostate surgery     Family History  Problem Relation Age of Onset  . Cancer Mother   . Alcohol abuse Father    Social History   Occupational History  . Not on file.   Social History Main Topics  . Smoking status: Never Smoker   . Smokeless tobacco: Not on file  . Alcohol Use: No  . Drug Use: No  . Sexual Activity:  Not on file    Do you feel safe at home?  No Are there smokers in your home (other than you)? No  Dietary issues and exercise activities discussed: Current Exercise Habits: Home exercise routine, Type of exercise: walking, Frequency (Times/Week): 2,  Intensity: Mild  Current Dietary habits:  Eating too much sugar       Objective:    Today's Vitals   09/18/15 0855  BP: 119/73  Pulse: 68  Temp: 97.2 F (36.2 C)  TempSrc: Oral  Height: 5\' 7"  (1.702 m)  Weight: 182 lb (82.555 kg)  SpO2: 95%   Body mass index is 28.5 kg/(m^2).  Physical Exam  Constitutional: He is oriented to person, place, and time. He appears well-developed.  HENT:  Head: Normocephalic and atraumatic.  Right Ear: External ear normal.  Left Ear: External ear normal.  Nose: Nose normal.  Mouth/Throat: Oropharynx is clear and moist.  Eyes: Conjunctivae and EOM are normal. Pupils are equal, round, and reactive to light. Right eye exhibits no discharge. Left eye exhibits no discharge.  Neck: Normal range of motion. Neck supple. No JVD present. No thyromegaly present.  Cardiovascular: Normal rate, regular rhythm and normal heart sounds.  Exam reveals no gallop and no friction rub.   No murmur heard. Pulmonary/Chest: Effort normal and breath sounds normal. No stridor. No respiratory distress. He has no wheezes. He has no rales. He exhibits no tenderness.  Abdominal: Soft. Bowel sounds are normal. He exhibits no distension and no mass. There is no tenderness. There is no rebound and no guarding. A hernia is present.  Genitourinary: Prostate normal and penis normal.  Lymphadenopathy:    He has no cervical adenopathy.  Neurological: He is alert and oriented to person, place, and time. He displays normal reflexes. No cranial nerve deficit. He exhibits normal muscle tone. Coordination normal.  Skin: Skin is warm and dry. No rash noted. No erythema.  Psychiatric: He has a normal mood and affect. His behavior is normal. Judgment and thought content normal.  Vitals reviewed.   Activities of Daily Living In your present state of health, do you have any difficulty performing the following activities: 09/18/2015  Hearing? N  Vision? Y  Difficulty concentrating or making  decisions? N  Walking or climbing stairs? N  Dressing or bathing? N  Doing errands, shopping? N     Depression Screen PHQ 2/9 Scores 09/18/2015 05/15/2015 09/04/2014  PHQ - 2 Score 0 0 0     Fall Risk Fall Risk  09/18/2015 05/15/2015 09/04/2014  Falls in the past year? No No No    Cognitive Function: No flowsheet data found.  Immunizations and Health Maintenance Immunization History  Administered Date(s) Administered  . Pneumococcal Conjugate-13 09/04/2014   Health Maintenance Due  Topic Date Due  . PNA vac Low Risk Adult (2 of 2 - PPSV23) 09/04/2015    Patient Care Team: Claretta Fraise, MD as PCP - General (Family Medicine)  Indicate any recent Medical Services you may have received from other than [facility] providers in the past year (date may be approximate).    Assessment:    Annual Wellness Visit    Screening Tests Health Maintenance  Topic Date Due  . PNA vac Low Risk Adult (2 of 2 - PPSV23) 09/04/2015  . ZOSTAVAX  12/18/2015 (Originally 10/02/1997)  . INFLUENZA VACCINE  01/14/2016  . TETANUS/TDAP  12/22/2022        Plan:   During the course of the visit Aaron Whitney was educated and counseled  about the following appropriate screening and preventive services:   Vaccines to include Pneumoccal, Influenza,  Td, Zostavax,  Colorectal cancer screening  Cardiovascular disease screening  Diabetes screening  Bone Denisty / Osteoporosis Screening  Glaucoma screening / Diabetic Eye Exam  Nutrition counseling  Prostate cancer screening  Smoking cessation counseling  Advanced Directives  Physical Activity   Goals    . Reduce sugar intake to X grams per day     Follow wife's lead on portions of desserts, etc.        Patient Instructions (the written plan) were given to the patient.   Claretta Fraise, MD   09/18/2015

## 2015-09-18 NOTE — Addendum Note (Signed)
Addended by: Marin Olp on: 09/18/2015 05:20 PM   Modules accepted: Miquel Dunn

## 2015-09-20 LAB — FECAL OCCULT BLOOD, IMMUNOCHEMICAL: Fecal Occult Bld: NEGATIVE

## 2015-09-23 LAB — CMP14+EGFR
A/G RATIO: 2 (ref 1.2–2.2)
ALK PHOS: 61 IU/L (ref 39–117)
ALT: 18 IU/L (ref 0–44)
AST: 22 IU/L (ref 0–40)
Albumin: 4.5 g/dL (ref 3.5–4.8)
BILIRUBIN TOTAL: 0.8 mg/dL (ref 0.0–1.2)
BUN/Creatinine Ratio: 16 (ref 10–24)
BUN: 18 mg/dL (ref 8–27)
CALCIUM: 9.5 mg/dL (ref 8.6–10.2)
CHLORIDE: 94 mmol/L — AB (ref 96–106)
CO2: 22 mmol/L (ref 18–29)
Creatinine, Ser: 1.1 mg/dL (ref 0.76–1.27)
GFR calc Af Amer: 74 mL/min/{1.73_m2} (ref >59)
GFR calc non Af Amer: 64 mL/min/{1.73_m2} (ref >59)
Globulin, Total: 2.3 g/dL (ref 1.5–4.5)
Glucose: 109 mg/dL — ABNORMAL HIGH (ref 65–99)
POTASSIUM: 4.8 mmol/L (ref 3.5–5.2)
Sodium: 133 mmol/L — ABNORMAL LOW (ref 134–144)
Total Protein: 6.8 g/dL (ref 6.0–8.5)

## 2015-09-23 LAB — CBC WITH DIFFERENTIAL/PLATELET
Basophils Absolute: 0 10*3/uL (ref 0.0–0.2)
Basos: 0 %
EOS (ABSOLUTE): 0.3 10*3/uL (ref 0.0–0.4)
Eos: 4 %
Hematocrit: 43.7 % (ref 37.5–51.0)
Hemoglobin: 15.2 g/dL (ref 12.6–17.7)
IMMATURE GRANULOCYTES: 0 %
Immature Grans (Abs): 0 10*3/uL (ref 0.0–0.1)
LYMPHS: 26 %
Lymphocytes Absolute: 2 10*3/uL (ref 0.7–3.1)
MCH: 31.1 pg (ref 26.6–33.0)
MCHC: 34.8 g/dL (ref 31.5–35.7)
MCV: 89 fL (ref 79–97)
MONOS ABS: 0.5 10*3/uL (ref 0.1–0.9)
Monocytes: 7 %
NEUTROS PCT: 63 %
Neutrophils Absolute: 4.8 10*3/uL (ref 1.4–7.0)
PLATELETS: 202 10*3/uL (ref 150–379)
RBC: 4.89 x10E6/uL (ref 4.14–5.80)
RDW: 13.9 % (ref 12.3–15.4)
WBC: 7.6 10*3/uL (ref 3.4–10.8)

## 2015-09-23 LAB — LIPID PANEL
CHOLESTEROL TOTAL: 180 mg/dL (ref 100–199)
Chol/HDL Ratio: 3.5 ratio units (ref 0.0–5.0)
HDL: 51 mg/dL (ref >39)
LDL Calculated: 116 mg/dL — ABNORMAL HIGH (ref 0–99)
TRIGLYCERIDES: 67 mg/dL (ref 0–149)
VLDL Cholesterol Cal: 13 mg/dL (ref 5–40)

## 2015-09-23 LAB — PSA TOTAL (REFLEX TO FREE): PROSTATE SPECIFIC AG, SERUM: 1.4 ng/mL (ref 0.0–4.0)

## 2015-09-23 LAB — VITAMIN D 1,25 DIHYDROXY
VITAMIN D3 1, 25 (OH): 50 pg/mL
Vitamin D 1, 25 (OH)2 Total: 50 pg/mL
Vitamin D2 1, 25 (OH)2: <10 pg/mL

## 2015-09-23 LAB — TESTOSTERONE,FREE AND TOTAL
TESTOSTERONE FREE: 5.1 pg/mL — AB (ref 6.6–18.1)
Testosterone: 420 ng/dL (ref 348–1197)

## 2015-12-16 ENCOUNTER — Other Ambulatory Visit: Payer: Self-pay | Admitting: Family Medicine

## 2016-03-04 ENCOUNTER — Other Ambulatory Visit: Payer: Self-pay | Admitting: Family Medicine

## 2016-03-11 ENCOUNTER — Encounter: Payer: Self-pay | Admitting: Family Medicine

## 2016-03-11 ENCOUNTER — Ambulatory Visit (INDEPENDENT_AMBULATORY_CARE_PROVIDER_SITE_OTHER): Payer: Medicare Other | Admitting: Family Medicine

## 2016-03-11 ENCOUNTER — Ambulatory Visit (INDEPENDENT_AMBULATORY_CARE_PROVIDER_SITE_OTHER): Payer: Medicare Other

## 2016-03-11 VITALS — BP 158/84 | HR 71 | Temp 97.1°F | Ht 67.0 in | Wt 181.0 lb

## 2016-03-11 DIAGNOSIS — I10 Essential (primary) hypertension: Secondary | ICD-10-CM | POA: Diagnosis not present

## 2016-03-11 DIAGNOSIS — E785 Hyperlipidemia, unspecified: Secondary | ICD-10-CM | POA: Insufficient documentation

## 2016-03-11 DIAGNOSIS — M47812 Spondylosis without myelopathy or radiculopathy, cervical region: Secondary | ICD-10-CM | POA: Insufficient documentation

## 2016-03-11 DIAGNOSIS — M159 Polyosteoarthritis, unspecified: Secondary | ICD-10-CM

## 2016-03-11 DIAGNOSIS — M4716 Other spondylosis with myelopathy, lumbar region: Secondary | ICD-10-CM | POA: Diagnosis not present

## 2016-03-11 DIAGNOSIS — C61 Malignant neoplasm of prostate: Secondary | ICD-10-CM

## 2016-03-11 DIAGNOSIS — R002 Palpitations: Secondary | ICD-10-CM

## 2016-03-11 DIAGNOSIS — E782 Mixed hyperlipidemia: Secondary | ICD-10-CM | POA: Insufficient documentation

## 2016-03-11 DIAGNOSIS — E291 Testicular hypofunction: Secondary | ICD-10-CM

## 2016-03-11 DIAGNOSIS — M150 Primary generalized (osteo)arthritis: Secondary | ICD-10-CM

## 2016-03-11 MED ORDER — CELECOXIB 200 MG PO CAPS: 200.0000 mg | ORAL_CAPSULE | Freq: Every day | ORAL | 5 refills | Status: DC

## 2016-03-11 NOTE — Progress Notes (Signed)
Subjective:  Patient ID: Aaron Whitney, male    DOB: 08/01/1935  Age: 80 y.o. MRN: [id number]  CC: Hypertension (pt here today for routine 6 month follow up on HTN but is also c/o joints cracking and grinding.)   HPI NOCHUM FENTER presents for  follow-up of hypertension. Patient has no history of headache chest pain or shortness of breath or recent cough.Several months ago he had quite a bit of elevated heart rate and palpitations. He was started on carvedilol and that seems to be helping. Patient also denies symptoms of TIA such as numbness weakness lateralizing. Patient checks  blood pressure at home and has not had any elevated readings recently. Systolic usually runs 416 or less. Patient denies side effects from his medication. States taking it regularly. Patient also has had problems with spinal arthritis. He is currently not taking medicine for that. He is having a lot of popping and cracking in the neck. Although he denies stiffness he says that when he gets up she has a lot of pain. This is beginning to get worse in the lumbar region as well as in the neck. Last week he had several days that it seemed to be bothering him in the hips knees and ankles as well. He also had some radiation into the thighs. When prescribed, he chose not to take the Celebrex that was offered. He is reconsidering at this time.  Follow-up BPH patient taking tamsulosin as noted. Nocturia is improved. He was treated 4 years ago for prostate cancer. He is concerned that due to low testosterone levels that he has developed osteoporosis. He continues to follow with his urologist Dr. Tommi Rumps.  Patient in for follow-up of elevated cholesterol. Doing well without complaints on current medication. Denies side effects of niacin including myalgia and arthralgia and nausea. Continues krill as well. Also in today for liver function testing. Currently no chest pain, shortness of breath or other cardiovascular related  symptoms noted. He chose not to fill the Zetia. He tends to be medication averse.   History [place] has a past medical history of Cancer ([place]) (2013); Hypertension; and Macular degeneration.   He has a past surgical history that includes ostate surgery.   His family history includes Alcohol abuse in his father; Cancer in his mother.He reports that he has never smoked. He has never used smokeless tobacco. He reports that he does not drink alcohol or use drugs.    ROS Review of Systems  Constitutional: Negative for chills, diaphoresis, fever and unexpected weight change.  HENT: Negative for congestion, hearing loss, rhinorrhea and sore throat.   Eyes: Negative for visual disturbance.  Respiratory: Negative for cough and shortness of breath.   Cardiovascular: Positive for palpitations. Negative for chest pain.  Gastrointestinal: Negative for abdominal pain, constipation and diarrhea.  Genitourinary: Positive for frequency. Negative for dysuria and flank pain.  Musculoskeletal: Positive for arthralgias, back pain, myalgias and neck pain. Negative for joint swelling.  Skin: Negative for rash.  Neurological: Negative for dizziness and headaches.  Psychiatric/Behavioral: Negative for dysphoric mood and sleep disturbance.    Objective:  BP (!) 158/84   Pulse 71   Temp 97.1 F (36.2 C) (Oral)   Ht '5\' 7"'  (1.702 m)   Wt 181 lb (82.1 kg)   BMI 28.35 kg/m   BP Readings from Last 3 Encounters:  03/11/16 (!) 158/84  09/18/15 119/73  05/15/15 (!) 142/80    Wt Readings from Last 3 Encounters:  03/11/16 181 lb (  82.1 kg)  09/18/15 182 lb (82.6 kg)  05/15/15 183 lb (83 kg)     Physical Exam   Lab Results  Component Value Date   WBC 7.6 09/18/2015   HGB 16.5 09/04/2014   HCT 43.7 09/18/2015   PLT 202 09/18/2015   GLUCOSE 109 (H) 09/18/2015   CHOL 180 09/18/2015   TRIG 67 09/18/2015   HDL 51 09/18/2015   LDLCALC 116 (H) 09/18/2015   ALT 18 09/18/2015   AST 22 09/18/2015     NA 133 (L) 09/18/2015   K 4.8 09/18/2015   CL 94 (L) 09/18/2015   CREATININE 1.10 09/18/2015   BUN 18 09/18/2015   CO2 22 09/18/2015   TSH 2.170 09/04/2014   PSA 1.0 09/04/2014    Patient was never admitted.  Assessment & Plan:   Rylyn was seen today for hypertension.  Diagnoses and all orders for this visit:  Essential hypertension -     CBC with Differential/Platelet -     CMP14+EGFR  Primary osteoarthritis involving multiple joints -     CBC with Differential/Platelet -     CMP14+EGFR  CA of prostate (HCC) -     CBC with Differential/Platelet -     CMP14+EGFR  Lumbar spondylosis with myelopathy -     CBC with Differential/Platelet -     CMP14+EGFR  Cervical spondylosis without myelopathy -     CBC with Differential/Platelet -     CMP14+EGFR  Hyperlipemia -     CBC with Differential/Platelet -     CMP14+EGFR -     Lipid panel  Palpitations -     EKG 12-Lead  Hypogonadism in male -     Cancel: DG Bone Density; Future -     DG [facility] DEXA; Future  CA prostate, adenoca (HCC) -     Cancel: DG Bone Density; Future -     DG [facility] DEXA; Future  Other orders -     celecoxib (CELEBREX) 200 MG capsule; Take 1 capsule (200 mg total) by mouth daily. With food, for muscle and joint pain.      I have discontinued Mr. Demery ezetimibe. I have also changed his celecoxib. Additionally, I am having him maintain his hyoscyamine, niacin, Krill Oil Omega-3, Misc Natural Products (OSTEO BI-FLEX JOINT SHIELD PO), carvedilol, amLODipine, lisinopril, and tamsulosin.  Meds ordered this encounter  Medications  . tamsulosin (FLOMAX) 0.4 MG CAPS capsule    Sig: Take by mouth.  . celecoxib (CELEBREX) 200 MG capsule    Sig: Take 1 capsule (200 mg total) by mouth daily. With food, for muscle and joint pain.    Dispense:  30 capsule    Refill:  5     Follow-up: Return in about 6 months (around 09/08/2016).  Claretta Fraise, M.D.

## 2016-03-12 LAB — LIPID PANEL
CHOLESTEROL TOTAL: 221 mg/dL — AB (ref 100–199)
Chol/HDL Ratio: 4.2 ratio units (ref 0.0–5.0)
HDL: 53 mg/dL (ref >39)
LDL CALC: 153 mg/dL — AB (ref 0–99)
TRIGLYCERIDES: 73 mg/dL (ref 0–149)
VLDL Cholesterol Cal: 15 mg/dL (ref 5–40)

## 2016-03-12 LAB — CMP14+EGFR
ALK PHOS: 55 IU/L (ref 39–117)
ALT: 16 IU/L (ref 0–44)
AST: 22 IU/L (ref 0–40)
Albumin/Globulin Ratio: 1.9 (ref 1.2–2.2)
Albumin: 4.5 g/dL (ref 3.5–4.8)
BILIRUBIN TOTAL: 0.6 mg/dL (ref 0.0–1.2)
BUN/Creatinine Ratio: 17 (ref 10–24)
BUN: 16 mg/dL (ref 8–27)
CHLORIDE: 94 mmol/L — AB (ref 96–106)
CO2: 25 mmol/L (ref 18–29)
CREATININE: 0.94 mg/dL (ref 0.76–1.27)
Calcium: 9.8 mg/dL (ref 8.6–10.2)
GFR calc Af Amer: 89 mL/min/{1.73_m2} (ref >59)
GFR calc non Af Amer: 77 mL/min/{1.73_m2} (ref >59)
GLOBULIN, TOTAL: 2.4 g/dL (ref 1.5–4.5)
Glucose: 105 mg/dL — ABNORMAL HIGH (ref 65–99)
POTASSIUM: 4.3 mmol/L (ref 3.5–5.2)
SODIUM: 135 mmol/L (ref 134–144)
Total Protein: 6.9 g/dL (ref 6.0–8.5)

## 2016-03-12 LAB — CBC WITH DIFFERENTIAL/PLATELET
BASOS ABS: 0 10*3/uL (ref 0.0–0.2)
Basos: 0 %
EOS (ABSOLUTE): 0.2 10*3/uL (ref 0.0–0.4)
EOS: 3 %
HEMATOCRIT: 44.7 % (ref 37.5–51.0)
Hemoglobin: 15.5 g/dL (ref 12.6–17.7)
Immature Grans (Abs): 0 10*3/uL (ref 0.0–0.1)
Immature Granulocytes: 0 %
LYMPHS ABS: 2 10*3/uL (ref 0.7–3.1)
Lymphs: 25 %
MCH: 31 pg (ref 26.6–33.0)
MCHC: 34.7 g/dL (ref 31.5–35.7)
MCV: 89 fL (ref 79–97)
MONOS ABS: 0.7 10*3/uL (ref 0.1–0.9)
Monocytes: 9 %
Neutrophils Absolute: 5 10*3/uL (ref 1.4–7.0)
Neutrophils: 63 %
Platelets: 203 10*3/uL (ref 150–379)
RBC: 5 x10E6/uL (ref 4.14–5.80)
RDW: 14.5 % (ref 12.3–15.4)
WBC: 7.9 10*3/uL (ref 3.4–10.8)

## 2016-03-17 ENCOUNTER — Other Ambulatory Visit: Payer: Self-pay | Admitting: Family Medicine

## 2016-03-18 ENCOUNTER — Encounter: Payer: Self-pay | Admitting: Family Medicine

## 2016-05-31 ENCOUNTER — Other Ambulatory Visit: Payer: Self-pay | Admitting: Family Medicine

## 2016-08-31 ENCOUNTER — Other Ambulatory Visit: Payer: Self-pay | Admitting: Family Medicine

## 2016-09-14 ENCOUNTER — Other Ambulatory Visit: Payer: Self-pay | Admitting: Family Medicine

## 2016-09-23 ENCOUNTER — Ambulatory Visit (INDEPENDENT_AMBULATORY_CARE_PROVIDER_SITE_OTHER): Payer: Medicare Other | Admitting: Family Medicine

## 2016-09-23 ENCOUNTER — Encounter: Payer: Self-pay | Admitting: Family Medicine

## 2016-09-23 VITALS — BP 135/70 | HR 67 | Temp 97.5°F | Ht 67.0 in | Wt 180.0 lb

## 2016-09-23 DIAGNOSIS — I10 Essential (primary) hypertension: Secondary | ICD-10-CM

## 2016-09-23 DIAGNOSIS — Z0000 Encounter for general adult medical examination without abnormal findings: Secondary | ICD-10-CM | POA: Diagnosis not present

## 2016-09-23 DIAGNOSIS — R002 Palpitations: Secondary | ICD-10-CM | POA: Diagnosis not present

## 2016-09-23 DIAGNOSIS — R293 Abnormal posture: Secondary | ICD-10-CM

## 2016-09-23 DIAGNOSIS — M159 Polyosteoarthritis, unspecified: Secondary | ICD-10-CM

## 2016-09-23 DIAGNOSIS — I8393 Asymptomatic varicose veins of bilateral lower extremities: Secondary | ICD-10-CM

## 2016-09-23 DIAGNOSIS — M47812 Spondylosis without myelopathy or radiculopathy, cervical region: Secondary | ICD-10-CM

## 2016-09-23 DIAGNOSIS — M150 Primary generalized (osteo)arthritis: Secondary | ICD-10-CM

## 2016-09-23 DIAGNOSIS — C61 Malignant neoplasm of prostate: Secondary | ICD-10-CM

## 2016-09-23 LAB — URINALYSIS
BILIRUBIN UA: NEGATIVE
GLUCOSE, UA: NEGATIVE
Leukocytes, UA: NEGATIVE
Nitrite, UA: NEGATIVE
PH UA: 6.5 (ref 5.0–7.5)
PROTEIN UA: NEGATIVE
RBC UA: NEGATIVE
Specific Gravity, UA: 1.015 (ref 1.005–1.030)
Urobilinogen, Ur: 0.2 mg/dL (ref 0.2–1.0)

## 2016-09-23 NOTE — Progress Notes (Signed)
Subjective:  Patient ID: Aaron Whitney, male    DOB: August 30, 1935  Age: 81 y.o. MRN: [id number]  CC: Annual Exam (pt here today for CPE and has questions regarding the EKG he had at his last visit)   HPI Aaron Whitney presents for annual exam. He has had occasional palpitations and is curious if this is related to his kast EKG. No chest  Pain dyspnea or syncope associated   follow-up of hypertension. Patient has no history of headache chest pain or shortness of breath or recent cough. Patient also denies symptoms of TIA such as numbness weakness lateralizing. Patient checks  blood pressure at home and has not had any elevated readings recently. Patient denies side effects from his medication. States taking it regularly. Patient also followed for elevated cholesterol. He is statin phobic. He is using niacin and Krill oil. No history of cardiac disease. No chest pain. Palpitations listed above have been minimal.  History Aaron Whitney has a past medical history of Cancer ([place]) (2013); Hypertension; and Macular degeneration.   He has a past surgical history that includes Prostate surgery.   His family history includes Alcohol abuse in his father; Cancer in his mother.He reports that he has never smoked. He has never used smokeless tobacco. He reports that he does not drink alcohol or use drugs.    ROS Review of Systems  Constitutional: Negative for activity change, appetite change, chills, diaphoresis, fatigue, fever and unexpected weight change.  HENT: Negative for congestion, ear pain, hearing loss, postnasal drip, rhinorrhea, sore throat, tinnitus and trouble swallowing.   Eyes: Negative for photophobia, pain, discharge and redness.  Respiratory: Negative for apnea, cough, choking, chest tightness, shortness of breath, wheezing and stridor.   Cardiovascular: Positive for palpitations. Negative for chest pain and leg swelling.  Gastrointestinal: Negative for abdominal  distention, abdominal pain, blood in stool, constipation, diarrhea, nausea and vomiting.  Endocrine: Negative for cold intolerance, heat intolerance, polydipsia, polyphagia and polyuria.  Genitourinary: Positive for frequency (with nocturia, weak stream, hesitancy. Hx of Ca prostate now in remission). Negative for difficulty urinating, dysuria, enuresis, flank pain, genital sores, hematuria and urgency.  Musculoskeletal: Positive for gait problem (A bit staggery. Loses balance. Hasn't fallen.). Negative for arthralgias and joint swelling.  Skin: Negative for color change, rash and wound.  Allergic/Immunologic: Negative for immunocompromised state.  Neurological: Negative for dizziness, tremors, seizures, syncope, facial asymmetry, speech difficulty, weakness, light-headedness, numbness and headaches.  Hematological: Does not bruise/bleed easily.  Psychiatric/Behavioral: Negative for agitation, behavioral problems, confusion, decreased concentration, dysphoric mood, hallucinations, sleep disturbance and suicidal ideas. The patient is not nervous/anxious and is not hyperactive.     Objective:  BP 135/70   Pulse 67   Temp 97.5 F (36.4 C) (Oral)   Ht '5\' 7"'  (1.702 m)   Wt 180 lb (81.6 kg)   BMI 28.19 kg/m   BP Readings from Last 3 Encounters:  09/23/16 135/70  03/11/16 (!) 158/84  09/18/15 119/73    Wt Readings from Last 3 Encounters:  09/23/16 180 lb (81.6 kg)  03/11/16 181 lb (82.1 kg)  09/18/15 182 lb (82.6 kg)     Physical Exam  Constitutional: He is oriented to person, place, and time. He appears well-developed and well-nourished.  HENT:  Head: Normocephalic and atraumatic.  Mouth/Throat: Oropharynx is clear and moist.  Eyes: EOM are normal. Pupils are equal, round, and reactive to light.  Neck: Normal range of motion. No tracheal deviation present. No thyromegaly present.  Cardiovascular: Normal rate,  regular rhythm and normal heart sounds.  Exam reveals no gallop and no  friction rub.   No murmur heard. Pulmonary/Chest: Breath sounds normal. He has no wheezes. He has no rales.  Abdominal: Soft. He exhibits no mass. There is no tenderness.  Musculoskeletal: Normal range of motion. He exhibits no edema.  Neurological: He is alert and oriented to person, place, and time.  Skin: Skin is warm and dry.  Psychiatric: He has a normal mood and affect.      Assessment & Plan:   Alonso was seen today for annual exam.  Diagnoses and all orders for this visit:  Palpitations -     EKG 12-Lead -     CBC with Differential/Platelet -     CMP14+EGFR -     Lipid panel -     Urinalysis  Essential hypertension -     CBC with Differential/Platelet -     CMP14+EGFR -     Urinalysis  CA of prostate (HCC) -     CBC with Differential/Platelet -     CMP14+EGFR -     PSA Total (Reflex To Free) -     Urinalysis  Cervical spondylosis without myelopathy -     CBC with Differential/Platelet -     CMP14+EGFR  Varicose veins of both lower extremities -     CBC with Differential/Platelet -     CMP14+EGFR -     Ambulatory referral to Vascular Surgery  Postural imbalance -     CBC with Differential/Platelet -     CMP14+EGFR  Primary osteoarthritis involving multiple joints -     CBC with Differential/Platelet -     CMP14+EGFR  Well adult exam       I have discontinued Mr. Rusk's celecoxib. I am also having him maintain his hyoscyamine, niacin, Krill Oil Omega-3, Misc Natural Products (OSTEO BI-FLEX JOINT SHIELD PO), tamsulosin, lisinopril, amLODipine, and carvedilol.  Allergies as of 09/23/2016   No Known Allergies     Medication List       Accurate as of 09/23/16 11:59 PM. Always use your most recent med list.          amLODipine 5 MG tablet Commonly known as:  NORVASC TAKE 1 TABLET EVERY DAY   carvedilol 6.25 MG tablet Commonly known as:  COREG TAKE 1 TABLET (6.25 MG TOTAL) BY MOUTH 2 (TWO) TIMES DAILY WITH A MEAL.   hyoscyamine  0.125 MG SL tablet Commonly known as:  LEVSIN SL 1-2 under tongue every four hours as needed for abdominal cramps, bloating or pain   Krill Oil Omega-3 500 MG Caps Take 2,000 mg by mouth at bedtime.   lisinopril 10 MG tablet Commonly known as:  PRINIVIL,ZESTRIL TAKE ONE TABLET BY MOUTH ONE TIME DAILY   niacin 500 MG tablet Commonly known as:  SLO-NIACIN Take 4 tablets (2,000 mg total) by mouth at bedtime.   OSTEO BI-FLEX JOINT SHIELD PO Take 1 tablet by mouth daily.   tamsulosin 0.4 MG Caps capsule Commonly known as:  FLOMAX Take by mouth.      Patient again counseled regarding control of cholesterol. He is to maintain his current blood pressure medications. Again patient declined statins. Cholesterol level pending other blood work as noted above as well. This includes PSA to track his prostate cancer. Additionally he's developed some BPH and will try tamsulosin.  Follow-up: No Follow-up on file.  Claretta Fraise, M.D.

## 2016-09-24 LAB — CMP14+EGFR
A/G RATIO: 1.7 (ref 1.2–2.2)
ALT: 22 IU/L (ref 0–44)
AST: 28 IU/L (ref 0–40)
Albumin: 4.4 g/dL (ref 3.5–4.8)
Alkaline Phosphatase: 55 IU/L (ref 39–117)
BUN/Creatinine Ratio: 13 (ref 10–24)
BUN: 14 mg/dL (ref 8–27)
Bilirubin Total: 0.5 mg/dL (ref 0.0–1.2)
CALCIUM: 9.4 mg/dL (ref 8.6–10.2)
CO2: 24 mmol/L (ref 18–29)
CREATININE: 1.05 mg/dL (ref 0.76–1.27)
Chloride: 94 mmol/L — ABNORMAL LOW (ref 96–106)
GFR calc Af Amer: 78 mL/min/{1.73_m2} (ref >59)
GFR calc non Af Amer: 68 mL/min/{1.73_m2} (ref >59)
GLUCOSE: 95 mg/dL (ref 65–99)
Globulin, Total: 2.6 g/dL (ref 1.5–4.5)
POTASSIUM: 4.6 mmol/L (ref 3.5–5.2)
Sodium: 135 mmol/L (ref 134–144)
TOTAL PROTEIN: 7 g/dL (ref 6.0–8.5)

## 2016-09-24 LAB — LIPID PANEL
Chol/HDL Ratio: 4.7 ratio (ref 0.0–5.0)
Cholesterol, Total: 213 mg/dL — ABNORMAL HIGH (ref 100–199)
HDL: 45 mg/dL (ref >39)
LDL Calculated: 152 mg/dL — ABNORMAL HIGH (ref 0–99)
TRIGLYCERIDES: 79 mg/dL (ref 0–149)
VLDL CHOLESTEROL CAL: 16 mg/dL (ref 5–40)

## 2016-09-24 LAB — CBC WITH DIFFERENTIAL/PLATELET
BASOS ABS: 0 10*3/uL (ref 0.0–0.2)
Basos: 0 %
EOS (ABSOLUTE): 0.2 10*3/uL (ref 0.0–0.4)
Eos: 3 %
Hematocrit: 43.1 % (ref 37.5–51.0)
Hemoglobin: 15.4 g/dL (ref 13.0–17.7)
IMMATURE GRANS (ABS): 0 10*3/uL (ref 0.0–0.1)
Immature Granulocytes: 0 %
LYMPHS: 24 %
Lymphocytes Absolute: 1.7 10*3/uL (ref 0.7–3.1)
MCH: 31.5 pg (ref 26.6–33.0)
MCHC: 35.7 g/dL (ref 31.5–35.7)
MCV: 88 fL (ref 79–97)
MONOS ABS: 0.7 10*3/uL (ref 0.1–0.9)
Monocytes: 10 %
NEUTROS PCT: 63 %
Neutrophils Absolute: 4.4 10*3/uL (ref 1.4–7.0)
PLATELETS: 203 10*3/uL (ref 150–379)
RBC: 4.89 x10E6/uL (ref 4.14–5.80)
RDW: 14 % (ref 12.3–15.4)
WBC: 7 10*3/uL (ref 3.4–10.8)

## 2016-09-24 LAB — PSA TOTAL (REFLEX TO FREE): Prostate Specific Ag, Serum: 1 ng/mL (ref 0.0–4.0)

## 2016-09-30 ENCOUNTER — Encounter: Payer: Medicare Other | Admitting: Family Medicine

## 2016-10-02 ENCOUNTER — Encounter: Payer: Medicare Other | Admitting: Family Medicine

## 2016-11-26 ENCOUNTER — Other Ambulatory Visit: Payer: Self-pay | Admitting: Family Medicine

## 2016-12-15 ENCOUNTER — Other Ambulatory Visit: Payer: Self-pay | Admitting: Family Medicine

## 2017-01-05 ENCOUNTER — Other Ambulatory Visit: Payer: Self-pay | Admitting: Family Medicine

## 2017-03-01 ENCOUNTER — Other Ambulatory Visit: Payer: Self-pay | Admitting: Family Medicine

## 2017-03-02 ENCOUNTER — Other Ambulatory Visit: Payer: Self-pay | Admitting: Family Medicine

## 2017-03-02 ENCOUNTER — Encounter: Payer: Self-pay | Admitting: Family Medicine

## 2017-03-02 MED ORDER — LISINOPRIL 10 MG PO TABS: 10.0000 mg | ORAL_TABLET | Freq: Every day | ORAL | 3 refills | Status: DC

## 2017-03-12 ENCOUNTER — Other Ambulatory Visit: Payer: Self-pay | Admitting: Family Medicine

## 2017-04-05 ENCOUNTER — Other Ambulatory Visit: Payer: Self-pay | Admitting: Family Medicine

## 2017-04-06 ENCOUNTER — Encounter: Payer: Self-pay | Admitting: Family Medicine

## 2017-04-06 ENCOUNTER — Ambulatory Visit (INDEPENDENT_AMBULATORY_CARE_PROVIDER_SITE_OTHER): Payer: Medicare Other | Admitting: Family Medicine

## 2017-04-06 VITALS — BP 142/79 | HR 72 | Temp 97.8°F | Ht 67.0 in | Wt 178.0 lb

## 2017-04-06 DIAGNOSIS — I10 Essential (primary) hypertension: Secondary | ICD-10-CM

## 2017-04-06 DIAGNOSIS — M47812 Spondylosis without myelopathy or radiculopathy, cervical region: Secondary | ICD-10-CM

## 2017-04-06 DIAGNOSIS — M4716 Other spondylosis with myelopathy, lumbar region: Secondary | ICD-10-CM | POA: Diagnosis not present

## 2017-04-06 DIAGNOSIS — M159 Polyosteoarthritis, unspecified: Secondary | ICD-10-CM

## 2017-04-06 DIAGNOSIS — C61 Malignant neoplasm of prostate: Secondary | ICD-10-CM | POA: Diagnosis not present

## 2017-04-06 DIAGNOSIS — M150 Primary generalized (osteo)arthritis: Secondary | ICD-10-CM

## 2017-04-06 DIAGNOSIS — H353290 Exudative age-related macular degeneration, unspecified eye, stage unspecified: Secondary | ICD-10-CM

## 2017-04-06 MED ORDER — AMLODIPINE BESYLATE 5 MG PO TABS: 5.0000 mg | ORAL_TABLET | Freq: Every day | ORAL | 1 refills | Status: DC

## 2017-04-06 MED ORDER — CARVEDILOL 6.25 MG PO TABS: 6.2500 mg | ORAL_TABLET | Freq: Two times a day (BID) | ORAL | 1 refills | Status: DC

## 2017-04-06 NOTE — Progress Notes (Signed)
 Subjective:  Patient ID: Aaron Whitney, male    DOB: 04/18/1936  Age: 81 y.o. MRN: [id number]  CC: Hypertension (pt here today for routine follow up of his chronic medical conditions and also c/o joints popping and aching)   HPI Aaron Whitney presents for  follow-up of hypertension. Patient has no history of headache chest pain or shortness of breath or recent cough. Patient also denies symptoms of TIA such as focal numbness or weakness. Patient checks  blood pressure at home. Readings recently In the 120s systolic. Patient denies side effects from medication. States taking it regularly.  Having some joint discomfort that his primary concern is that they are popping. This is not painful it is getting more common and it is all over. There is some occasional soreness in the other joints as well. Patient has a long history of trying to avoid medication. He had used some Osteo Bi-Flex last year and saw him make a red fish oil and he wonders about using those at this time.   History Aaron Whitney has a past medical history of Cancer ([facility]) (2013); Hypertension; and Macular degeneration.   He has a past surgical history that includes Prostate surgery.   His family history includes Alcohol abuse in his father; Cancer in his mother.He reports that he has never smoked. He has never used smokeless tobacco. He reports that he does not drink alcohol or use drugs.  Current Outpatient Prescriptions on File Prior to Visit  Medication Sig Dispense Refill  . lisinopril (PRINIVIL,ZESTRIL) 10 MG tablet Take 1 tablet (10 mg total) by mouth daily. 90 tablet 3   No current facility-administered medications on file prior to visit.     ROS Review of Systems  Constitutional: Negative for chills, diaphoresis, fever and unexpected weight change.  HENT: Negative for congestion, hearing loss, rhinorrhea and sore throat.   Eyes: Negative for visual disturbance.  Respiratory: Negative for cough and  shortness of breath.   Cardiovascular: Negative for chest pain.  Gastrointestinal: Negative for abdominal pain, constipation and diarrhea.  Genitourinary: Negative for dysuria and flank pain.  Musculoskeletal: Positive for arthralgias (mild and intermittent involving most of his major joints.). Negative for joint swelling.  Skin: Negative for rash.  Neurological: Negative for dizziness and headaches.  Psychiatric/Behavioral: Negative for dysphoric mood and sleep disturbance.    Objective:  BP (!) 142/79   Pulse 72   Temp 97.8 F (36.6 C) (Oral)   Ht 5' 7" (1.702 m)   Wt 178 lb (80.7 kg)   BMI 27.88 kg/m   BP Readings from Last 3 Encounters:  04/06/17 (!) 142/79  09/23/16 135/70  03/11/16 (!) 158/84    Wt Readings from Last 3 Encounters:  04/06/17 178 lb (80.7 kg)  09/23/16 180 lb (81.6 kg)  03/11/16 181 lb (82.1 kg)     Physical Exam  Constitutional: He is oriented to person, place, and time. He appears well-developed and well-nourished. No distress.  HENT:  Head: Normocephalic and atraumatic.  Right Ear: External ear normal.  Left Ear: External ear normal.  Nose: Nose normal.  Mouth/Throat: Oropharynx is clear and moist.  Eyes: Pupils are equal, round, and reactive to light. Conjunctivae and EOM are normal.  Neck: Normal range of motion. Neck supple. No thyromegaly present.  Cardiovascular: Normal rate, regular rhythm and normal heart sounds.   No murmur heard. Pulmonary/Chest: Effort normal and breath sounds normal. No respiratory distress. He has no wheezes. He has no rales.  Abdominal: Soft. Bowel   sounds are normal. He exhibits no distension. There is no tenderness.  Musculoskeletal: Normal range of motion.  Lymphadenopathy:    He has no cervical adenopathy.  Neurological: He is alert and oriented to person, place, and time. He has normal reflexes.  Skin: Skin is warm and dry.  Psychiatric: He has a normal mood and affect. His behavior is normal. Judgment and  thought content normal.      Assessment & Plan:   Aaron Whitney was seen today for hypertension.  Diagnoses and all orders for this visit:  Essential hypertension -     CBC with Differential/Platelet -     CMP14+EGFR -     Lipid panel  Lumbar spondylosis with myelopathy  Cervical spondylosis without myelopathy  Primary osteoarthritis involving multiple joints  CA of prostate (HCC)  Exudative age-related macular degeneration, unspecified laterality, unspecified stage (HCC) -     TSH  Other orders -     amLODipine (NORVASC) 5 MG tablet; Take 1 tablet (5 mg total) by mouth daily. -     carvedilol (COREG) 6.25 MG tablet; Take 1 tablet (6.25 mg total) by mouth 2 (two) times daily with a meal.   Allergies as of 04/06/2017   No Known Allergies     Medication List       Accurate as of 04/06/17  1:45 PM. Always use your most recent med list.          amLODipine 5 MG tablet Commonly known as:  NORVASC Take 1 tablet (5 mg total) by mouth daily.   carvedilol 6.25 MG tablet Commonly known as:  COREG Take 1 tablet (6.25 mg total) by mouth 2 (two) times daily with a meal.   lisinopril 10 MG tablet Commonly known as:  PRINIVIL,ZESTRIL Take 1 tablet (10 mg total) by mouth daily.   tamsulosin 0.4 MG Caps capsule Commonly known as:  FLOMAX Take 0.4 mg by mouth daily.       Meds ordered this encounter  Medications  . tamsulosin (FLOMAX) 0.4 MG CAPS capsule    Sig: Take 0.4 mg by mouth daily.  . amLODipine (NORVASC) 5 MG tablet    Sig: Take 1 tablet (5 mg total) by mouth daily.    Dispense:  90 tablet    Refill:  1  . carvedilol (COREG) 6.25 MG tablet    Sig: Take 1 tablet (6.25 mg total) by mouth 2 (two) times daily with a meal.    Dispense:  180 tablet    Refill:  1    Flu shot declined. Discussion with patient regarding arthritis. The focus of treatment is generally for pain. Tylenol is first line. Should he want something to help with the popping that might be  more of a lubrication issue. See if Osteo Bi-Flex and Krill oil is of use.  Follow-up: Return in about 6 months (around 10/05/2017).  Warren Stacks, M.D. 

## 2017-04-06 NOTE — Telephone Encounter (Signed)
Last seen 09/23/16  Dr Livia Snellen

## 2017-04-07 LAB — CBC WITH DIFFERENTIAL/PLATELET
BASOS: 0 %
Basophils Absolute: 0 10*3/uL (ref 0.0–0.2)
EOS (ABSOLUTE): 0.2 10*3/uL (ref 0.0–0.4)
Eos: 2 %
Hematocrit: 44.5 % (ref 37.5–51.0)
Hemoglobin: 15.5 g/dL (ref 13.0–17.7)
IMMATURE GRANS (ABS): 0 10*3/uL (ref 0.0–0.1)
Immature Granulocytes: 0 %
LYMPHS: 27 %
Lymphocytes Absolute: 1.8 10*3/uL (ref 0.7–3.1)
MCH: 31.2 pg (ref 26.6–33.0)
MCHC: 34.8 g/dL (ref 31.5–35.7)
MCV: 90 fL (ref 79–97)
MONOCYTES: 8 %
Monocytes Absolute: 0.5 10*3/uL (ref 0.1–0.9)
NEUTROS ABS: 4.3 10*3/uL (ref 1.4–7.0)
Neutrophils: 63 %
PLATELETS: 209 10*3/uL (ref 150–379)
RBC: 4.97 x10E6/uL (ref 4.14–5.80)
RDW: 14.2 % (ref 12.3–15.4)
WBC: 6.8 10*3/uL (ref 3.4–10.8)

## 2017-04-07 LAB — CMP14+EGFR
A/G RATIO: 1.9 (ref 1.2–2.2)
ALT: 16 IU/L (ref 0–44)
AST: 22 IU/L (ref 0–40)
Albumin: 4.6 g/dL (ref 3.5–4.8)
Alkaline Phosphatase: 56 IU/L (ref 39–117)
BILIRUBIN TOTAL: 0.8 mg/dL (ref 0.0–1.2)
BUN/Creatinine Ratio: 14 (ref 10–24)
BUN: 15 mg/dL (ref 8–27)
CHLORIDE: 94 mmol/L — AB (ref 96–106)
CO2: 24 mmol/L (ref 20–29)
Calcium: 9.7 mg/dL (ref 8.6–10.2)
Creatinine, Ser: 1.11 mg/dL (ref 0.76–1.27)
GFR calc non Af Amer: 63 mL/min/{1.73_m2} (ref >59)
GFR, EST AFRICAN AMERICAN: 73 mL/min/{1.73_m2} (ref >59)
GLOBULIN, TOTAL: 2.4 g/dL (ref 1.5–4.5)
Glucose: 114 mg/dL — ABNORMAL HIGH (ref 65–99)
POTASSIUM: 4.9 mmol/L (ref 3.5–5.2)
SODIUM: 134 mmol/L (ref 134–144)
TOTAL PROTEIN: 7 g/dL (ref 6.0–8.5)

## 2017-04-07 LAB — LIPID PANEL
Chol/HDL Ratio: 4.6 ratio (ref 0.0–5.0)
Cholesterol, Total: 210 mg/dL — ABNORMAL HIGH (ref 100–199)
HDL: 46 mg/dL (ref >39)
LDL Calculated: 146 mg/dL — ABNORMAL HIGH (ref 0–99)
Triglycerides: 88 mg/dL (ref 0–149)
VLDL Cholesterol Cal: 18 mg/dL (ref 5–40)

## 2017-04-07 LAB — TSH: TSH: 1.81 u[IU]/mL (ref 0.450–4.500)

## 2017-10-04 ENCOUNTER — Other Ambulatory Visit: Payer: Self-pay | Admitting: Family Medicine

## 2017-10-05 ENCOUNTER — Encounter: Payer: Self-pay | Admitting: Family Medicine

## 2017-10-05 ENCOUNTER — Ambulatory Visit: Payer: Medicare Other

## 2017-10-05 ENCOUNTER — Ambulatory Visit (INDEPENDENT_AMBULATORY_CARE_PROVIDER_SITE_OTHER): Payer: Medicare Other | Admitting: Family Medicine

## 2017-10-05 ENCOUNTER — Ambulatory Visit: Payer: Medicare Other | Admitting: Family Medicine

## 2017-10-05 VITALS — BP 150/81 | HR 69 | Temp 97.1°F | Ht 67.0 in | Wt 177.0 lb

## 2017-10-05 DIAGNOSIS — E782 Mixed hyperlipidemia: Secondary | ICD-10-CM

## 2017-10-05 DIAGNOSIS — C61 Malignant neoplasm of prostate: Secondary | ICD-10-CM

## 2017-10-05 DIAGNOSIS — Z23 Encounter for immunization: Secondary | ICD-10-CM

## 2017-10-05 DIAGNOSIS — Z0000 Encounter for general adult medical examination without abnormal findings: Secondary | ICD-10-CM | POA: Diagnosis not present

## 2017-10-05 DIAGNOSIS — M4716 Other spondylosis with myelopathy, lumbar region: Secondary | ICD-10-CM

## 2017-10-05 DIAGNOSIS — M150 Primary generalized (osteo)arthritis: Secondary | ICD-10-CM

## 2017-10-05 DIAGNOSIS — I10 Essential (primary) hypertension: Secondary | ICD-10-CM

## 2017-10-05 DIAGNOSIS — R293 Abnormal posture: Secondary | ICD-10-CM

## 2017-10-05 DIAGNOSIS — M47812 Spondylosis without myelopathy or radiculopathy, cervical region: Secondary | ICD-10-CM

## 2017-10-05 DIAGNOSIS — M159 Polyosteoarthritis, unspecified: Secondary | ICD-10-CM

## 2017-10-05 LAB — CMP14+EGFR
A/G RATIO: 1.8 (ref 1.2–2.2)
ALK PHOS: 62 IU/L (ref 39–117)
ALT: 21 IU/L (ref 0–44)
AST: 27 IU/L (ref 0–40)
Albumin: 4.4 g/dL (ref 3.5–4.7)
BUN/Creatinine Ratio: 14 (ref 10–24)
BUN: 14 mg/dL (ref 8–27)
Bilirubin Total: 0.9 mg/dL (ref 0.0–1.2)
CO2: 21 mmol/L (ref 20–29)
Calcium: 9.6 mg/dL (ref 8.6–10.2)
Chloride: 97 mmol/L (ref 96–106)
Creatinine, Ser: 1.01 mg/dL (ref 0.76–1.27)
GFR calc Af Amer: 81 mL/min/{1.73_m2} (ref >59)
GFR calc non Af Amer: 70 mL/min/{1.73_m2} (ref >59)
GLOBULIN, TOTAL: 2.4 g/dL (ref 1.5–4.5)
Glucose: 106 mg/dL — ABNORMAL HIGH (ref 65–99)
POTASSIUM: 4.7 mmol/L (ref 3.5–5.2)
SODIUM: 134 mmol/L (ref 134–144)
Total Protein: 6.8 g/dL (ref 6.0–8.5)

## 2017-10-05 LAB — URINALYSIS
Bilirubin, UA: NEGATIVE
Glucose, UA: NEGATIVE
KETONES UA: NEGATIVE
Leukocytes, UA: NEGATIVE
NITRITE UA: NEGATIVE
Protein, UA: NEGATIVE
RBC, UA: NEGATIVE
Specific Gravity, UA: 1.015 (ref 1.005–1.030)
UUROB: 0.2 mg/dL (ref 0.2–1.0)
pH, UA: 7 (ref 5.0–7.5)

## 2017-10-05 LAB — CBC WITH DIFFERENTIAL/PLATELET
Basophils Absolute: 0 10*3/uL (ref 0.0–0.2)
Basos: 0 %
EOS (ABSOLUTE): 0.4 10*3/uL (ref 0.0–0.4)
Eos: 4 %
Hematocrit: 41.8 % (ref 37.5–51.0)
Hemoglobin: 14.7 g/dL (ref 13.0–17.7)
Immature Grans (Abs): 0 10*3/uL (ref 0.0–0.1)
Immature Granulocytes: 0 %
LYMPHS ABS: 2.2 10*3/uL (ref 0.7–3.1)
Lymphs: 24 %
MCH: 30.8 pg (ref 26.6–33.0)
MCHC: 35.2 g/dL (ref 31.5–35.7)
MCV: 88 fL (ref 79–97)
MONOS ABS: 0.9 10*3/uL (ref 0.1–0.9)
Monocytes: 10 %
Neutrophils Absolute: 5.5 10*3/uL (ref 1.4–7.0)
Neutrophils: 62 %
Platelets: 235 10*3/uL (ref 150–379)
RBC: 4.77 x10E6/uL (ref 4.14–5.80)
RDW: 13.3 % (ref 12.3–15.4)
WBC: 9.1 10*3/uL (ref 3.4–10.8)

## 2017-10-05 LAB — LIPID PANEL
CHOLESTEROL TOTAL: 223 mg/dL — AB (ref 100–199)
Chol/HDL Ratio: 5 ratio (ref 0.0–5.0)
HDL: 45 mg/dL (ref >39)
LDL Calculated: 161 mg/dL — ABNORMAL HIGH (ref 0–99)
Triglycerides: 86 mg/dL (ref 0–149)
VLDL Cholesterol Cal: 17 mg/dL (ref 5–40)

## 2017-10-05 MED ORDER — CARVEDILOL 6.25 MG PO TABS: 6.2500 mg | ORAL_TABLET | Freq: Two times a day (BID) | ORAL | 1 refills | Status: DC

## 2017-10-05 NOTE — Progress Notes (Signed)
Subjective:  Patient ID: Aaron Whitney, male    DOB: 10-Nov-1935  Age: 82 y.o. MRN: [id number]  CC: Annual Exam (pt here today for CPE)   HPI Aaron Whitney presents for annual physical exam.  Occasional feeling of being off balance he had a fall one time a week ago when he was carrying the neighbors dog across the road.  There is no dizziness associated.  He says it only happens a couple of times a year.  It only lasts a moment or 2.  He is just concerned because he fell.   He does have a history of spondylosis and it is having some low back pain that is manageable with Tylenol and/or Aleve.  There is a popping sensation noted in the shoulders and knees but no pain.  Patient tells me that he checks his blood pressure at home all the time and his readings are always good the worst it gets is an occasional blip to the low 419Q systolic.  Depression screen [facility] 2/9 10/05/2017 04/06/2017 09/23/2016  Decreased Interest 0 0 0  Down, Depressed, Hopeless 0 0 0  PHQ - 2 Score 0 0 0    History Gadge has a past medical history of Cancer ([place]) (2013), Hypertension, and Macular degeneration.   He has a past surgical history that includes Prostate surgery.   His family history includes Alcohol abuse in his father; Cancer in his mother.He reports that he has never smoked. He has never used smokeless tobacco. He reports that he does not drink alcohol or use drugs.    ROS Review of Systems  Constitutional: Negative for activity change, appetite change, chills, diaphoresis, fatigue, fever and unexpected weight change.  HENT: Negative for congestion, ear pain, hearing loss, postnasal drip, rhinorrhea, sore throat, tinnitus and trouble swallowing.   Eyes: Negative for photophobia, pain, discharge and redness.  Respiratory: Negative for apnea, cough, choking, chest tightness, shortness of breath, wheezing and stridor.   Cardiovascular: Negative for chest pain, palpitations and leg  swelling.  Gastrointestinal: Negative for abdominal distention, abdominal pain, blood in stool, constipation, diarrhea, nausea and vomiting.  Endocrine: Negative for cold intolerance, heat intolerance, polydipsia, polyphagia and polyuria.  Genitourinary: Negative for difficulty urinating, dysuria, enuresis, flank pain, frequency, genital sores, hematuria and urgency.  Musculoskeletal: Negative for arthralgias and joint swelling.  Skin: Negative for color change, rash and wound.  Allergic/Immunologic: Negative for immunocompromised state.  Neurological: Negative for dizziness, tremors, seizures, syncope, facial asymmetry, speech difficulty, weakness, light-headedness, numbness and headaches.  Hematological: Does not bruise/bleed easily.  Psychiatric/Behavioral: Negative for agitation, behavioral problems, confusion, decreased concentration, dysphoric mood, hallucinations, sleep disturbance and suicidal ideas. The patient is not nervous/anxious and is not hyperactive.     Objective:  BP (!) 150/81   Pulse 69   Temp (!) 97.1 F (36.2 C) (Oral)   Ht '5\' 7"'  (1.702 m)   Wt 177 lb (80.3 kg)   BMI 27.72 kg/m   BP Readings from Last 3 Encounters:  10/05/17 (!) 150/81  04/06/17 (!) 142/79  09/23/16 135/70    Wt Readings from Last 3 Encounters:  10/05/17 177 lb (80.3 kg)  04/06/17 178 lb (80.7 kg)  09/23/16 180 lb (81.6 kg)     Physical Exam  Constitutional: He is oriented to person, place, and time. He appears well-developed and well-nourished.  HENT:  Head: Normocephalic and atraumatic.  Mouth/Throat: Oropharynx is clear and moist.  Eyes: Pupils are equal, round, and reactive to light. EOM are normal.  Neck: Normal range of motion. No tracheal deviation present. No thyromegaly present.  Cardiovascular: Normal rate, regular rhythm and normal heart sounds. Exam reveals no gallop and no friction rub.  No murmur heard. Pulmonary/Chest: Breath sounds normal. He has no wheezes. He has no  rales.  Abdominal: Soft. He exhibits no mass. There is no tenderness.  Musculoskeletal: Normal range of motion. He exhibits no edema.  Neurological: He is alert and oriented to person, place, and time.  Skin: Skin is warm and dry.  Psychiatric: He has a normal mood and affect.      Assessment & Plan:   Oron was seen today for annual exam.  Diagnoses and all orders for this visit:  Well adult exam -     CBC with Differential/Platelet -     CMP14+EGFR -     Lipid panel -     Urinalysis  CA of prostate (HCC) -     DG [facility] DEXA  Postural imbalance  Lumbar spondylosis with myelopathy  Cervical spondylosis without myelopathy  Mixed hyperlipidemia  Primary osteoarthritis involving multiple joints  Essential hypertension  Other orders -     Pneumococcal polysaccharide vaccine 23-valent greater than or equal to 2yo subcutaneous/IM -     carvedilol (COREG) 6.25 MG tablet; Take 1 tablet (6.25 mg total) by mouth 2 (two) times daily with a meal.       I am having [name]. Kavanagh maintain his lisinopril, tamsulosin, amLODipine, and carvedilol.  Allergies as of 10/05/2017   No Known Allergies     Medication List        Accurate as of 10/05/17 12:29 PM. Always use your most recent med list.          amLODipine 5 MG tablet Commonly known as:  NORVASC TAKE ONE TABLET BY MOUTH ONE TIME DAILY   carvedilol 6.25 MG tablet Commonly known as:  COREG Take 1 tablet (6.25 mg total) by mouth 2 (two) times daily with a meal.   lisinopril 10 MG tablet Commonly known as:  PRINIVIL,ZESTRIL Take 1 tablet (10 mg total) by mouth daily.   tamsulosin 0.4 MG Caps capsule Commonly known as:  FLOMAX Take 0.4 mg by mouth daily.      For now he can manage his arthritis and spondylosis symptoms with Tylenol or ibuprofen or Aleve.  The balance problems seen very minimal and self-limited.  He will report those again if they become more frequent.  He is going to do a blood  pressure log at home and bring that with him to follow-up or come in sooner if he notices more readings over 140/90.  Follow-up: No follow-ups on file.  Claretta Fraise, M.D.

## 2017-10-06 ENCOUNTER — Other Ambulatory Visit: Payer: Self-pay | Admitting: Family Medicine

## 2018-01-06 ENCOUNTER — Other Ambulatory Visit: Payer: Self-pay | Admitting: Family Medicine

## 2018-02-10 ENCOUNTER — Other Ambulatory Visit: Payer: Self-pay | Admitting: Family Medicine

## 2018-04-05 ENCOUNTER — Other Ambulatory Visit: Payer: Self-pay | Admitting: Family Medicine

## 2018-04-05 NOTE — Telephone Encounter (Signed)
Last seen 10/05/17

## 2018-04-06 ENCOUNTER — Ambulatory Visit: Payer: Medicare Other

## 2018-04-06 ENCOUNTER — Ambulatory Visit (INDEPENDENT_AMBULATORY_CARE_PROVIDER_SITE_OTHER): Payer: Medicare Other | Admitting: Family Medicine

## 2018-04-06 ENCOUNTER — Encounter: Payer: Self-pay | Admitting: Family Medicine

## 2018-04-06 ENCOUNTER — Ambulatory Visit (INDEPENDENT_AMBULATORY_CARE_PROVIDER_SITE_OTHER): Payer: Medicare Other

## 2018-04-06 ENCOUNTER — Other Ambulatory Visit: Payer: Self-pay | Admitting: Family Medicine

## 2018-04-06 VITALS — BP 139/77 | HR 68 | Temp 98.2°F | Ht 67.0 in | Wt 179.0 lb

## 2018-04-06 DIAGNOSIS — C61 Malignant neoplasm of prostate: Secondary | ICD-10-CM

## 2018-04-06 DIAGNOSIS — R351 Nocturia: Secondary | ICD-10-CM

## 2018-04-06 DIAGNOSIS — Z13820 Encounter for screening for osteoporosis: Secondary | ICD-10-CM

## 2018-04-06 DIAGNOSIS — H353290 Exudative age-related macular degeneration, unspecified eye, stage unspecified: Secondary | ICD-10-CM

## 2018-04-06 DIAGNOSIS — I10 Essential (primary) hypertension: Secondary | ICD-10-CM | POA: Diagnosis not present

## 2018-04-06 DIAGNOSIS — M47812 Spondylosis without myelopathy or radiculopathy, cervical region: Secondary | ICD-10-CM | POA: Diagnosis not present

## 2018-04-06 DIAGNOSIS — M4716 Other spondylosis with myelopathy, lumbar region: Secondary | ICD-10-CM

## 2018-04-06 DIAGNOSIS — E782 Mixed hyperlipidemia: Secondary | ICD-10-CM | POA: Diagnosis not present

## 2018-04-06 DIAGNOSIS — N401 Enlarged prostate with lower urinary tract symptoms: Secondary | ICD-10-CM

## 2018-04-06 MED ORDER — TAMSULOSIN HCL 0.4 MG PO CAPS: 0.8000 mg | ORAL_CAPSULE | Freq: Every day | ORAL | 5 refills | Status: DC

## 2018-04-06 MED ORDER — LISINOPRIL 10 MG PO TABS: 10.0000 mg | ORAL_TABLET | Freq: Every day | ORAL | 1 refills | Status: DC

## 2018-04-06 MED ORDER — CARVEDILOL 6.25 MG PO TABS: 6.2500 mg | ORAL_TABLET | Freq: Two times a day (BID) | ORAL | 1 refills | Status: DC

## 2018-04-06 MED ORDER — AMLODIPINE BESYLATE 5 MG PO TABS: 5.0000 mg | ORAL_TABLET | Freq: Every day | ORAL | 1 refills | Status: DC

## 2018-04-06 NOTE — Progress Notes (Signed)
Subjective:  Patient ID: Aaron Whitney, male    DOB: September 02, 1935  Age: 82 y.o. MRN: [id number]  CC: Medical Management of Chronic Issues   HPI [name] presents for  follow-up of hypertension. Patient has no history of headache chest pain or shortness of breath or recent cough. Patient also denies symptoms of TIA such as focal numbness or weakness. Patient denies side effects from medication. States taking it regularly. Up 3-4X a night to urinate.  Patient says that his prostate doctor says he is doing well with regard to the cancer.  His PSA was 1.0 about 4 months ago.  At that time he also told him he could increase the tamsulosin to 2 pills at bedtime.  The patient has not done so yet but is willing to do so based on today's conversation with him.  Patient is seen ophthalmology, Dr. Tollie Pizza, for macular degeneration.  However he has been told there is nothing that can be done for it at this time.  It seems stable currently.  Patient does have elevated cholesterol but does not desire treatment for it.  His arthritis seems to be getting quite a bit worse.  It is affecting the neck shoulders and back.  He is taking Osteo Bi-Flex and turmeric and getting fair relief.  He does not want prescription medication although he does occasionally add in Aleve when pain is bad. History Treyon has a past medical history of Cancer ([place]) (2013), Hypertension, and Macular degeneration.   He has a past surgical history that includes Prostate surgery.   His family history includes Alcohol abuse in his father; Cancer in his mother.He reports that he has never smoked. He has never used smokeless tobacco. He reports that he does not drink alcohol or use drugs.  Current Outpatient Medications on File Prior to Visit  Medication Sig Dispense Refill  . amLODipine (NORVASC) 5 MG tablet TAKE ONE TABLET BY MOUTH ONE TIME DAILY  90 tablet 0  . carvedilol (COREG) 6.25 MG tablet Take 1 tablet (6.25  mg total) by mouth 2 (two) times daily with a meal. 180 tablet 1  . lisinopril (PRINIVIL,ZESTRIL) 10 MG tablet TAKE ONE TABLET BY MOUTH ONE TIME DAILY  90 tablet 0   No current facility-administered medications on file prior to visit.     ROS Review of Systems  Constitutional: Negative.   HENT: Negative.   Eyes: Negative for visual disturbance.  Respiratory: Negative for cough and shortness of breath.   Cardiovascular: Negative for chest pain and leg swelling.  Gastrointestinal: Negative for abdominal pain, diarrhea, nausea and vomiting.  Genitourinary: Positive for difficulty urinating and frequency.  Musculoskeletal: Positive for arthralgias, back pain, myalgias and neck pain.  Skin: Negative for rash.  Neurological: Negative for headaches.  Psychiatric/Behavioral: Negative for sleep disturbance.    Objective:  BP 139/77   Pulse 68   Temp 98.2 F (36.8 C) (Oral)   Ht 5\' 7"  (1.702 m)   Wt 179 lb (81.2 kg)   BMI 28.04 kg/m   BP Readings from Last 3 Encounters:  04/06/18 139/77  10/05/17 (!) 150/81  04/06/17 (!) 142/79    Wt Readings from Last 3 Encounters:  04/06/18 179 lb (81.2 kg)  10/05/17 177 lb (80.3 kg)  04/06/17 178 lb (80.7 kg)     Physical Exam  Constitutional: He is oriented to person, place, and time. He appears well-developed and well-nourished. No distress.  HENT:  Head: Normocephalic and atraumatic.  Right Ear: External  ear normal.  Left Ear: External ear normal.  Nose: Nose normal.  Mouth/Throat: Oropharynx is clear and moist.  Eyes: Pupils are equal, round, and reactive to light. Conjunctivae and EOM are normal.  Neck: Normal range of motion. Neck supple.  Cardiovascular: Normal rate, regular rhythm and normal heart sounds.  No murmur heard. Pulmonary/Chest: Effort normal and breath sounds normal. No respiratory distress. He has no wheezes. He has no rales.  Abdominal: Soft. There is no tenderness.  Musculoskeletal: Normal range of motion.    Neurological: He is alert and oriented to person, place, and time. He has normal reflexes.  Skin: Skin is warm and dry.  Psychiatric: He has a normal mood and affect. His behavior is normal. Judgment and thought content normal.      Assessment & Plan:   Arihaan was seen today for medical management of chronic issues.  Diagnoses and all orders for this visit:  Cervical spondylosis without myelopathy  Mixed hyperlipidemia  Essential hypertension  Exudative age-related macular degeneration, unspecified laterality, unspecified stage (HCC)  Lumbar spondylosis with myelopathy  Benign prostatic hyperplasia with nocturia  Other orders -     tamsulosin (FLOMAX) 0.4 MG CAPS capsule; Take 2 capsules (0.8 mg total) by mouth daily.   Allergies as of 04/06/2018   No Known Allergies     Medication List        Accurate as of 04/06/18 11:12 AM. Always use your most recent med list.          amLODipine 5 MG tablet Commonly known as:  NORVASC TAKE ONE TABLET BY MOUTH ONE TIME DAILY   carvedilol 6.25 MG tablet Commonly known as:  COREG Take 1 tablet (6.25 mg total) by mouth 2 (two) times daily with a meal.   lisinopril 10 MG tablet Commonly known as:  PRINIVIL,ZESTRIL TAKE ONE TABLET BY MOUTH ONE TIME DAILY   tamsulosin 0.4 MG Caps capsule Commonly known as:  FLOMAX Take 2 capsules (0.8 mg total) by mouth daily.       Meds ordered this encounter  Medications  . tamsulosin (FLOMAX) 0.4 MG CAPS capsule    Sig: Take 2 capsules (0.8 mg total) by mouth daily.    Dispense:  60 capsule    Refill:  5   Patient declined flu shot today.  Follow-up: Return in about 6 months (around 10/06/2018).  Claretta Fraise, M.D.

## 2018-04-08 ENCOUNTER — Encounter: Payer: Self-pay | Admitting: Family Medicine

## 2018-04-09 ENCOUNTER — Encounter: Payer: Self-pay | Admitting: Family Medicine

## 2018-04-18 ENCOUNTER — Encounter: Payer: Self-pay | Admitting: Family Medicine

## 2018-04-22 ENCOUNTER — Encounter: Payer: Self-pay | Admitting: Family Medicine

## 2018-09-02 ENCOUNTER — Encounter: Payer: Self-pay | Admitting: Family Medicine

## 2018-09-02 ENCOUNTER — Other Ambulatory Visit: Payer: Self-pay | Admitting: Family Medicine

## 2018-09-19 ENCOUNTER — Other Ambulatory Visit: Payer: Self-pay | Admitting: Family Medicine

## 2018-10-10 ENCOUNTER — Ambulatory Visit: Payer: Medicare Other | Admitting: Family Medicine

## 2018-10-12 ENCOUNTER — Ambulatory Visit: Payer: Medicare Other | Admitting: Family Medicine

## 2018-10-25 ENCOUNTER — Encounter: Payer: Self-pay | Admitting: Family Medicine

## 2018-10-25 ENCOUNTER — Other Ambulatory Visit: Payer: Self-pay

## 2018-10-25 ENCOUNTER — Ambulatory Visit (INDEPENDENT_AMBULATORY_CARE_PROVIDER_SITE_OTHER): Payer: Medicare Other | Admitting: Family Medicine

## 2018-10-25 VITALS — BP 138/88 | HR 71 | Temp 97.5°F | Ht 67.0 in | Wt 182.0 lb

## 2018-10-25 DIAGNOSIS — C61 Malignant neoplasm of prostate: Secondary | ICD-10-CM

## 2018-10-25 DIAGNOSIS — M150 Primary generalized (osteo)arthritis: Secondary | ICD-10-CM

## 2018-10-25 DIAGNOSIS — R2689 Other abnormalities of gait and mobility: Secondary | ICD-10-CM

## 2018-10-25 DIAGNOSIS — I10 Essential (primary) hypertension: Secondary | ICD-10-CM | POA: Diagnosis not present

## 2018-10-25 DIAGNOSIS — M4716 Other spondylosis with myelopathy, lumbar region: Secondary | ICD-10-CM

## 2018-10-25 DIAGNOSIS — Z0001 Encounter for general adult medical examination with abnormal findings: Secondary | ICD-10-CM | POA: Diagnosis not present

## 2018-10-25 DIAGNOSIS — M159 Polyosteoarthritis, unspecified: Secondary | ICD-10-CM

## 2018-10-25 DIAGNOSIS — E782 Mixed hyperlipidemia: Secondary | ICD-10-CM

## 2018-10-25 DIAGNOSIS — Z0000 Encounter for general adult medical examination without abnormal findings: Secondary | ICD-10-CM

## 2018-10-25 DIAGNOSIS — Z1321 Encounter for screening for nutritional disorder: Secondary | ICD-10-CM

## 2018-10-25 LAB — URINALYSIS
Bilirubin, UA: NEGATIVE
Glucose, UA: NEGATIVE
Ketones, UA: NEGATIVE
Leukocytes,UA: NEGATIVE
Nitrite, UA: NEGATIVE
Protein,UA: NEGATIVE
RBC, UA: NEGATIVE
Specific Gravity, UA: 1.02 (ref 1.005–1.030)
Urobilinogen, Ur: 0.2 mg/dL (ref 0.2–1.0)
pH, UA: 7.5 (ref 5.0–7.5)

## 2018-10-25 NOTE — Progress Notes (Signed)
Subjective:  Patient ID: Aaron Whitney, male    DOB: 01-14-1936  Age: 83 y.o. MRN: [id number]  CC: Annual Exam   HPI Aaron Whitney presents for annual exam. States balance is not good, but no worse than before he increased his own amlodipine. He did that due to elevated home readings. He had one fall. It was a month ago. He was putting on his pants and fell backwards, hiting his head. Went to [facility] and got staples. Wound healing well now. Otherwise, seeing his urologist next month for Ca prostate follow up. He is taking tamsulosin 0.4 mg at night. Says it wasn't much better with 0.8 mg. Also arthritis of the spine causes popping and crackling, but soreness and pain are manageable. Does not desire medication for that.   Depression screen [facility] 2/9 10/25/2018 04/06/2018 10/05/2017  Decreased Interest 0 0 0  Down, Depressed, Hopeless 0 0 0  PHQ - 2 Score 0 0 0  Altered sleeping 0 - -  Tired, decreased energy 0 - -  Change in appetite 0 - -  Feeling bad or failure about yourself  0 - -  Trouble concentrating 0 - -  Moving slowly or fidgety/restless 0 - -  Suicidal thoughts 0 - -  PHQ-9 Score 0 - -    History Aaron Whitney has a past medical history of Cancer ([place]) (2013), Hypertension, and Macular degeneration.   He has a past surgical history that includes Prostate surgery.   His family history includes Alcohol abuse in his father; Cancer in his mother.He reports that he has never smoked. He has never used smokeless tobacco. He reports that he does not drink alcohol or use drugs.    ROS Review of Systems  Constitutional: Negative for activity change, fatigue and unexpected weight change.  HENT: Negative for congestion, ear pain, hearing loss, postnasal drip and trouble swallowing.   Eyes: Negative for pain and visual disturbance.  Respiratory: Negative for cough, chest tightness and shortness of breath.   Cardiovascular: Negative for chest pain, palpitations and leg  swelling.  Gastrointestinal: Negative for abdominal distention, abdominal pain, blood in stool, constipation, diarrhea, nausea and vomiting.  Endocrine: Negative for cold intolerance, heat intolerance and polydipsia.  Genitourinary: Negative for difficulty urinating, dysuria, flank pain, frequency (4X/night) and urgency.  Musculoskeletal: Positive for arthralgias and back pain. Negative for gait problem and joint swelling.  Skin: Negative for color change, rash and wound.  Neurological: Negative for dizziness, syncope, speech difficulty, weakness, light-headedness, numbness and headaches.       Feels off balance at times  Hematological: Does not bruise/bleed easily.  Psychiatric/Behavioral: Negative for confusion, decreased concentration, dysphoric mood and sleep disturbance. The patient is not nervous/anxious.     Objective:  BP 138/88    Pulse 71    Temp (!) 97.5 F (36.4 C) (Oral)    Ht '5\' 7"'  (1.702 m)    Wt 182 lb (82.6 kg)    BMI 28.51 kg/m   BP Readings from Last 3 Encounters:  10/25/18 138/88  04/06/18 139/77  10/05/17 (!) 150/81    Wt Readings from Last 3 Encounters:  10/25/18 182 lb (82.6 kg)  04/06/18 179 lb (81.2 kg)  10/05/17 177 lb (80.3 kg)     Physical Exam Constitutional:      Appearance: He is well-developed.  HENT:     Head: Normocephalic and atraumatic.  Eyes:     Pupils: Pupils are equal, round, and reactive to light.  Neck:  Musculoskeletal: Normal range of motion.     Thyroid: No thyromegaly.     Trachea: No tracheal deviation.  Cardiovascular:     Rate and Rhythm: Normal rate and regular rhythm.     Heart sounds: Normal heart sounds. No murmur. No friction rub. No gallop.   Pulmonary:     Breath sounds: Normal breath sounds. No wheezing or rales.  Abdominal:     General: Bowel sounds are normal. There is no distension.     Palpations: Abdomen is soft. There is no mass.     Tenderness: There is no abdominal tenderness.     Hernia: There is no  hernia in the right inguinal area or left inguinal area.  Genitourinary:    Penis: Normal.      Scrotum/Testes: Normal.  Musculoskeletal: Normal range of motion.  Lymphadenopathy:     Cervical: No cervical adenopathy.  Skin:    General: Skin is warm and dry.  Neurological:     Mental Status: He is alert and oriented to person, place, and time.       Assessment & Plan:   Aaron Whitney was seen today for annual exam.  Diagnoses and all orders for this visit:  Well adult exam  Primary osteoarthritis involving multiple joints  Essential hypertension -     CBC with Differential/Platelet  CA of prostate (HCC) -     Urinalysis  Lumbar spondylosis with myelopathy  Mixed hyperlipidemia -     CMP14+EGFR -     Lipid panel  Balance problem -     TSH  Encounter for vitamin deficiency screening -     VITAMIN D 25 Hydroxy (Vit-D Deficiency, Fractures)       I am having Aaron Whitney. Lawyer "[name]" maintain his carvedilol, tamsulosin, lisinopril, and amLODipine.  Allergies as of 10/25/2018   No Known Allergies     Medication List       Accurate as of Oct 25, 2018 12:03 PM. If you have any questions, ask your nurse or doctor.        amLODipine 5 MG tablet Commonly known as:  NORVASC TAKE ONE TABLET BY MOUTH ONE TIME DAILY   carvedilol 6.25 MG tablet Commonly known as:  COREG Take 1 tablet (6.25 mg total) by mouth 2 (two) times daily with a meal.   lisinopril 10 MG tablet Commonly known as:  ZESTRIL TAKE ONE TABLET BY MOUTH ONE TIME DAILY   tamsulosin 0.4 MG Caps capsule Commonly known as:  FLOMAX Take 2 capsules (0.8 mg total) by mouth daily.      CPE labs ordered. Take allegra for allergy sx. Monitor balance off & on 10 mg amlodipine for 2 weeks each (61m vs 10 mg) Let me know about balance changes and BP.  Follow-up: Return in about 6 months (around 04/27/2019).  WClaretta Fraise M.D.

## 2018-10-26 LAB — CBC WITH DIFFERENTIAL/PLATELET
Basophils Absolute: 0.1 10*3/uL (ref 0.0–0.2)
Basos: 1 %
EOS (ABSOLUTE): 0.3 10*3/uL (ref 0.0–0.4)
Eos: 3 %
Hematocrit: 45.6 % (ref 37.5–51.0)
Hemoglobin: 16 g/dL (ref 13.0–17.7)
Immature Grans (Abs): 0.1 10*3/uL (ref 0.0–0.1)
Immature Granulocytes: 1 %
Lymphocytes Absolute: 2 10*3/uL (ref 0.7–3.1)
Lymphs: 23 %
MCH: 30.9 pg (ref 26.6–33.0)
MCHC: 35.1 g/dL (ref 31.5–35.7)
MCV: 88 fL (ref 79–97)
Monocytes Absolute: 0.9 10*3/uL (ref 0.1–0.9)
Monocytes: 10 %
Neutrophils Absolute: 5.5 10*3/uL (ref 1.4–7.0)
Neutrophils: 62 %
Platelets: 249 10*3/uL (ref 150–450)
RBC: 5.18 x10E6/uL (ref 4.14–5.80)
RDW: 13 % (ref 11.6–15.4)
WBC: 8.8 10*3/uL (ref 3.4–10.8)

## 2018-10-26 LAB — CMP14+EGFR
ALT: 25 IU/L (ref 0–44)
AST: 25 IU/L (ref 0–40)
Albumin/Globulin Ratio: 2 (ref 1.2–2.2)
Albumin: 4.8 g/dL — ABNORMAL HIGH (ref 3.6–4.6)
Alkaline Phosphatase: 56 IU/L (ref 39–117)
BUN/Creatinine Ratio: 15 (ref 10–24)
BUN: 15 mg/dL (ref 8–27)
Bilirubin Total: 0.7 mg/dL (ref 0.0–1.2)
CO2: 23 mmol/L (ref 20–29)
Calcium: 9.9 mg/dL (ref 8.6–10.2)
Chloride: 92 mmol/L — ABNORMAL LOW (ref 96–106)
Creatinine, Ser: 0.97 mg/dL (ref 0.76–1.27)
GFR calc Af Amer: 84 mL/min/{1.73_m2} (ref >59)
GFR calc non Af Amer: 73 mL/min/{1.73_m2} (ref >59)
Globulin, Total: 2.4 g/dL (ref 1.5–4.5)
Glucose: 111 mg/dL — ABNORMAL HIGH (ref 65–99)
Potassium: 5.1 mmol/L (ref 3.5–5.2)
Sodium: 131 mmol/L — ABNORMAL LOW (ref 134–144)
Total Protein: 7.2 g/dL (ref 6.0–8.5)

## 2018-10-26 LAB — LIPID PANEL
Chol/HDL Ratio: 4.5 ratio (ref 0.0–5.0)
Cholesterol, Total: 205 mg/dL — ABNORMAL HIGH (ref 100–199)
HDL: 46 mg/dL (ref >39)
LDL Calculated: 148 mg/dL — ABNORMAL HIGH (ref 0–99)
Triglycerides: 57 mg/dL (ref 0–149)
VLDL Cholesterol Cal: 11 mg/dL (ref 5–40)

## 2018-10-26 LAB — TSH: TSH: 2.46 u[IU]/mL (ref 0.450–4.500)

## 2018-10-26 LAB — VITAMIN D 25 HYDROXY (VIT D DEFICIENCY, FRACTURES): Vit D, 25-Hydroxy: 54.7 ng/mL (ref 30.0–100.0)

## 2018-11-01 ENCOUNTER — Encounter: Payer: Self-pay | Admitting: Family Medicine

## 2018-11-01 ENCOUNTER — Other Ambulatory Visit: Payer: Self-pay | Admitting: Family Medicine

## 2018-11-01 MED ORDER — AMLODIPINE BESYLATE 10 MG PO TABS: 10.0000 mg | ORAL_TABLET | Freq: Every day | ORAL | 3 refills | Status: DC

## 2018-11-21 ENCOUNTER — Other Ambulatory Visit: Payer: Self-pay | Admitting: Family Medicine

## 2018-12-04 ENCOUNTER — Other Ambulatory Visit: Payer: Self-pay | Admitting: Family Medicine

## 2019-05-02 ENCOUNTER — Ambulatory Visit (INDEPENDENT_AMBULATORY_CARE_PROVIDER_SITE_OTHER): Payer: Medicare Other | Admitting: Family Medicine

## 2019-05-02 ENCOUNTER — Other Ambulatory Visit: Payer: Self-pay

## 2019-05-02 ENCOUNTER — Encounter: Payer: Self-pay | Admitting: Family Medicine

## 2019-05-02 DIAGNOSIS — C61 Malignant neoplasm of prostate: Secondary | ICD-10-CM | POA: Diagnosis not present

## 2019-05-02 DIAGNOSIS — I10 Essential (primary) hypertension: Secondary | ICD-10-CM

## 2019-05-02 DIAGNOSIS — N401 Enlarged prostate with lower urinary tract symptoms: Secondary | ICD-10-CM | POA: Diagnosis not present

## 2019-05-02 DIAGNOSIS — I8312 Varicose veins of left lower extremity with inflammation: Secondary | ICD-10-CM

## 2019-05-02 DIAGNOSIS — I8311 Varicose veins of right lower extremity with inflammation: Secondary | ICD-10-CM

## 2019-05-02 DIAGNOSIS — R351 Nocturia: Secondary | ICD-10-CM

## 2019-05-02 MED ORDER — FLUOCINONIDE-E 0.05 % EX CREA: 1.0000 "application " | TOPICAL_CREAM | Freq: Two times a day (BID) | CUTANEOUS | 5 refills | Status: DC

## 2019-05-02 MED ORDER — OSTEO BI-FLEX TRIPLE STRENGTH PO TABS: 1.0000 | ORAL_TABLET | Freq: Two times a day (BID) | ORAL | 1 refills | Status: AC

## 2019-05-02 MED ORDER — MIRABEGRON ER 25 MG PO TB24: 25.0000 mg | ORAL_TABLET | Freq: Every day | ORAL | 2 refills | Status: DC

## 2019-05-02 NOTE — Progress Notes (Signed)
Subjective:    Patient ID: Aaron Whitney, male    DOB: 10/18/1935, 83 y.o.   MRN: [id number]   HPI: Aaron Whitney is a 83 y.o. male presenting for   [facility] in June.   Breaking out on foot and leg. Red dots throughout. Present for a month. Gets a little swelling in the ankle. Varicose veins seem to be spreading. Getting up to pee 4 times a night   presents for  follow-up of hypertension. Patient has no history of headache chest pain or shortness of breath or recent cough. Patient also denies symptoms of TIA such as focal numbness or weakness. Patient denies side effects from medication. States taking it regularly. Systolic staying in the [id number] & [id number]  Depression screen [facility] 2/9 10/25/2018 04/06/2018 10/05/2017 04/06/2017 09/23/2016  Decreased Interest 0 0 0 0 0  Down, Depressed, Hopeless 0 0 0 0 0  PHQ - 2 Score 0 0 0 0 0  Altered sleeping 0 - - - -  Tired, decreased energy 0 - - - -  Change in appetite 0 - - - -  Feeling bad or failure about yourself  0 - - - -  Trouble concentrating 0 - - - -  Moving slowly or fidgety/restless 0 - - - -  Suicidal thoughts 0 - - - -  PHQ-9 Score 0 - - - -     Relevant past medical, surgical, family and social history reviewed and updated as indicated.  Interim medical history since our last visit reviewed. Allergies and medications reviewed and updated.  ROS:  Review of Systems  Constitutional: Negative.   HENT: Negative.   Eyes: Negative for visual disturbance.  Respiratory: Negative for cough and shortness of breath.   Cardiovascular: Negative for chest pain and leg swelling.  Gastrointestinal: Negative for abdominal pain, diarrhea, nausea and vomiting.  Genitourinary: Positive for frequency. Negative for decreased urine volume, difficulty urinating, dysuria, hematuria and urgency.  Musculoskeletal: Positive for arthralgias (good relief with Osteo BiFlex). Negative for myalgias.  Skin: Negative for rash.   Neurological: Negative for headaches.  Psychiatric/Behavioral: Negative for sleep disturbance.     Social History   Tobacco Use  Smoking Status Never Smoker  Smokeless Tobacco Never Used       Objective:     Wt Readings from Last 3 Encounters:  10/25/18 182 lb (82.6 kg)  04/06/18 179 lb (81.2 kg)  10/05/17 177 lb (80.3 kg)     Exam deferred. Pt. Harboring due to COVID 19. Phone visit performed.   Assessment & Plan:   1. Essential hypertension   2. Benign prostatic hyperplasia with nocturia   3. CA of prostate ([place])   4. Varicose veins of both lower extremities with inflammation     Meds ordered this encounter  Medications  . fluocinonide-emollient (LIDEX-E) 0.05 % cream    Sig: Apply 1 application topically 2 (two) times daily. To affected areas    Dispense:  60 g    Refill:  5  . mirabegron ER (MYRBETRIQ) 25 MG TB24 tablet    Sig: Take 1 tablet (25 mg total) by mouth daily.    Dispense:  30 tablet    Refill:  2  . Misc Natural Products (OSTEO BI-FLEX TRIPLE STRENGTH) TABS    Sig: Take 1 tablet by mouth 2 (two) times daily.    Dispense:  1 tablet    Refill:  1    No orders of the defined types were placed  in this encounter.     Diagnoses and all orders for this visit:  Essential hypertension  Benign prostatic hyperplasia with nocturia  CA of prostate ([place])  Varicose veins of both lower extremities with inflammation  Other orders -     fluocinonide-emollient (LIDEX-E) 0.05 % cream; Apply 1 application topically 2 (two) times daily. To affected areas -     mirabegron ER (MYRBETRIQ) 25 MG TB24 tablet; Take 1 tablet (25 mg total) by mouth daily. -     Misc Natural Products (OSTEO BI-FLEX TRIPLE STRENGTH) TABS; Take 1 tablet by mouth 2 (two) times daily.    Virtual Visit via telephone Note  I discussed the limitations, risks, security and privacy concerns of performing an evaluation and management service by telephone and the availability of in person  appointments. The patient was identified with two identifiers. Pt.expressed understanding and agreed to proceed. Pt. Is at home. Dr. Livia Snellen is in his office.  Follow Up Instructions:   I discussed the assessment and treatment plan with the patient. The patient was provided an opportunity to ask questions and all were answered. The patient agreed with the plan and demonstrated an understanding of the instructions.   The patient was advised to call back or seek an in-person evaluation if the symptoms worsen or if the condition fails to improve as anticipated.   Total minutes including chart review and phone contact time: 30   Follow up plan: Return in about 6 months (around 10/30/2019).  Claretta Fraise, MD [place]

## 2019-05-23 ENCOUNTER — Other Ambulatory Visit: Payer: Self-pay | Admitting: Family Medicine

## 2019-05-31 ENCOUNTER — Other Ambulatory Visit: Payer: Self-pay | Admitting: Family Medicine

## 2019-08-21 ENCOUNTER — Other Ambulatory Visit: Payer: Self-pay | Admitting: Family Medicine

## 2019-11-06 ENCOUNTER — Ambulatory Visit: Payer: Medicare Other | Admitting: Family Medicine

## 2019-11-20 ENCOUNTER — Other Ambulatory Visit: Payer: Self-pay

## 2019-11-20 ENCOUNTER — Encounter: Payer: Self-pay | Admitting: Family Medicine

## 2019-11-20 ENCOUNTER — Ambulatory Visit (INDEPENDENT_AMBULATORY_CARE_PROVIDER_SITE_OTHER): Payer: Medicare Other | Admitting: Family Medicine

## 2019-11-20 VITALS — BP 130/75 | HR 73 | Temp 97.9°F | Resp 20 | Ht 67.0 in | Wt 186.4 lb

## 2019-11-20 DIAGNOSIS — R5383 Other fatigue: Secondary | ICD-10-CM

## 2019-11-20 DIAGNOSIS — M4716 Other spondylosis with myelopathy, lumbar region: Secondary | ICD-10-CM | POA: Diagnosis not present

## 2019-11-20 DIAGNOSIS — R2689 Other abnormalities of gait and mobility: Secondary | ICD-10-CM

## 2019-11-20 DIAGNOSIS — M47812 Spondylosis without myelopathy or radiculopathy, cervical region: Secondary | ICD-10-CM

## 2019-11-20 DIAGNOSIS — Z0001 Encounter for general adult medical examination with abnormal findings: Secondary | ICD-10-CM | POA: Diagnosis not present

## 2019-11-20 DIAGNOSIS — I10 Essential (primary) hypertension: Secondary | ICD-10-CM

## 2019-11-20 DIAGNOSIS — C61 Malignant neoplasm of prostate: Secondary | ICD-10-CM

## 2019-11-20 DIAGNOSIS — E559 Vitamin D deficiency, unspecified: Secondary | ICD-10-CM

## 2019-11-20 DIAGNOSIS — M159 Polyosteoarthritis, unspecified: Secondary | ICD-10-CM

## 2019-11-20 DIAGNOSIS — E782 Mixed hyperlipidemia: Secondary | ICD-10-CM

## 2019-11-20 DIAGNOSIS — M8949 Other hypertrophic osteoarthropathy, multiple sites: Secondary | ICD-10-CM

## 2019-11-20 MED ORDER — CARVEDILOL 6.25 MG PO TABS: 6.2500 mg | ORAL_TABLET | Freq: Two times a day (BID) | ORAL | 1 refills | Status: DC

## 2019-11-20 MED ORDER — AMLODIPINE BESYLATE 10 MG PO TABS: 10.0000 mg | ORAL_TABLET | Freq: Every day | ORAL | 3 refills | Status: DC

## 2019-11-20 MED ORDER — TAMSULOSIN HCL 0.4 MG PO CAPS: 0.8000 mg | ORAL_CAPSULE | Freq: Every day | ORAL | 1 refills | Status: DC

## 2019-11-20 NOTE — Progress Notes (Signed)
Subjective:  Patient ID: Aaron Whitney, male    DOB: 1935/11/07  Age: 84 y.o. MRN: [id number]  CC: No chief complaint on file.   HPI Aaron Whitney presents for annual physical examination.  Patient's main concern is that he is feeling more fatigued.  He is also continuing to have problems with BPH.  He discontinued the Myrbetriq and is having a UroLift procedure with Dr. Allison Whitney in [place] next month.  He reports that macular degeneration has diminished his vision.  It is not amenable to injections.  He also has occasional short-lived problems with balance when he bends forward.  Patient has multiple varicose veins noted in the legs.  He would like to have them repaired.  He requests referral to vascular surgery.  There is vascular surgeon on [address] in [place] [employer] that he requests.  Depression screen [facility] 2/9 11/20/2019 10/25/2018 04/06/2018  Decreased Interest 0 0 0  Down, Depressed, Hopeless 0 0 0  PHQ - 2 Score 0 0 0  Altered sleeping - 0 -  Tired, decreased energy - 0 -  Change in appetite - 0 -  Feeling bad or failure about yourself  - 0 -  Trouble concentrating - 0 -  Moving slowly or fidgety/restless - 0 -  Suicidal thoughts - 0 -  PHQ-9 Score - 0 -    History Aaron Whitney has a past medical history of Cancer ([place]) (2013), Hypertension, and Macular degeneration.   He has a past surgical history that includes Prostate surgery.   His family history includes Alcohol abuse in his father; Cancer in his mother.He reports that he has never smoked. He has never used smokeless tobacco. He reports that he does not drink alcohol or use drugs.    ROS Review of Systems  Constitutional: Positive for fatigue. Negative for activity change and unexpected weight change.  HENT: Negative for congestion, ear pain, hearing loss, postnasal drip and trouble swallowing.   Eyes: Negative for pain and visual disturbance.  Respiratory: Negative for cough, chest  tightness and shortness of breath.   Cardiovascular: Negative for chest pain, palpitations and leg swelling.  Gastrointestinal: Negative for abdominal distention, abdominal pain, blood in stool, constipation, diarrhea, nausea and vomiting.  Endocrine: Negative for cold intolerance, heat intolerance and polydipsia.  Genitourinary: Negative for difficulty urinating, dysuria, flank pain, frequency and urgency.  Musculoskeletal: Negative for arthralgias and joint swelling.  Skin: Negative for color change, rash and wound.  Neurological: Positive for light-headedness. Negative for dizziness, syncope, speech difficulty, weakness, numbness and headaches.  Hematological: Does not bruise/bleed easily.  Psychiatric/Behavioral: Negative for confusion, decreased concentration, dysphoric mood and sleep disturbance. The patient is not nervous/anxious.     Objective:  BP 130/75   Pulse 73   Temp 97.9 F (36.6 C) (Temporal)   Resp 20   Ht _0  (1.702 m)   Wt 186 lb 6 oz (84.5 kg)   SpO2 95%   BMI 29.19 kg/m   BP Readings from Last 3 Encounters:  11/20/19 130/75  10/25/18 138/88  04/06/18 139/77    Wt Readings from Last 3 Encounters:  11/20/19 186 lb 6 oz (84.5 kg)  10/25/18 182 lb (82.6 kg)  04/06/18 179 lb (81.2 kg)     Physical Exam Constitutional:      Appearance: He is well-developed.  HENT:     Head: Normocephalic and atraumatic.  Eyes:     Pupils: Pupils are equal, round, and reactive to light.  Neck:  Thyroid: No thyromegaly.     Trachea: No tracheal deviation.  Cardiovascular:     Rate and Rhythm: Normal rate and regular rhythm.     Heart sounds: Normal heart sounds. No murmur. No friction rub. No gallop.   Pulmonary:     Breath sounds: Normal breath sounds. No wheezing or rales.  Abdominal:     General: Bowel sounds are normal. There is no distension.     Palpations: Abdomen is soft. There is no mass.     Tenderness: There is no abdominal tenderness.     Hernia:  There is no hernia in the left inguinal area.  Genitourinary:    Penis: Normal.      Testes: Normal.  Musculoskeletal:        General: Normal range of motion.     Cervical back: Normal range of motion.  Lymphadenopathy:     Cervical: No cervical adenopathy.  Skin:    General: Skin is warm and dry.  Neurological:     Mental Status: He is alert and oriented to person, place, and time.       Assessment & Plan:   Diagnoses and all orders for this visit:  Primary osteoarthritis involving multiple joints -     CBC with Differential/Platelet -     CMP14+EGFR  Essential hypertension -     CBC with Differential/Platelet -     CMP14+EGFR  Lumbar spondylosis with myelopathy -     CBC with Differential/Platelet -     CMP14+EGFR  Cervical spondylosis without myelopathy -     CBC with Differential/Platelet -     CMP14+EGFR  Mixed hyperlipidemia -     CBC with Differential/Platelet -     CMP14+EGFR -     Lipid panel  Imbalance -     CBC with Differential/Platelet -     CMP14+EGFR  Fatigue, unspecified type -     CBC with Differential/Platelet -     CMP14+EGFR -     TSH -     Vitamin B12  CA of prostate (HCC) -     PSA Total (Reflex To Free)  Vitamin D deficiency -     VITAMIN D 25 Hydroxy (Vit-D Deficiency, Fractures)  Other orders -     amLODipine (NORVASC) 10 MG tablet; Take 1 tablet (10 mg total) by mouth daily. -     carvedilol (COREG) 6.25 MG tablet; Take 1 tablet (6.25 mg total) by mouth 2 (two) times daily with a meal. -     tamsulosin (FLOMAX) 0.4 MG CAPS capsule; Take 2 capsules (0.8 mg total) by mouth daily.       I have discontinued Aaron Whitney. Aaron "[name]"'s fluocinonide-emollient, mirabegron ER, and lisinopril. I have also changed his carvedilol. Additionally, I am having him maintain his Osteo Bi-Flex Triple Strength, amLODipine, and tamsulosin.  Allergies as of 11/20/2019   No Known Allergies     Medication List       Accurate as of  November 20, 2019  9:55 PM. If you have any questions, ask your nurse or doctor.        STOP taking these medications   fluocinonide-emollient 0.05 % cream Commonly known as: LIDEX-E Stopped by: Claretta Fraise, MD   lisinopril 10 MG tablet Commonly known as: ZESTRIL Stopped by: Claretta Fraise, MD   mirabegron ER 25 MG Tb24 tablet Commonly known as: Myrbetriq Stopped by: Claretta Fraise, MD     TAKE these medications   amLODipine 10 MG tablet Commonly  known as: NORVASC Take 1 tablet (10 mg total) by mouth daily.   carvedilol 6.25 MG tablet Commonly known as: COREG Take 1 tablet (6.25 mg total) by mouth 2 (two) times daily with a meal.   Osteo Bi-Flex Triple Strength Tabs Take 1 tablet by mouth 2 (two) times daily.   tamsulosin 0.4 MG Caps capsule Commonly known as: FLOMAX Take 2 capsules (0.8 mg total) by mouth daily.        Follow-up: No follow-ups on file.  Claretta Fraise, M.D.

## 2019-11-21 ENCOUNTER — Ambulatory Visit (INDEPENDENT_AMBULATORY_CARE_PROVIDER_SITE_OTHER): Payer: Medicare Other

## 2019-11-21 VITALS — BP 137/80

## 2019-11-21 DIAGNOSIS — Z0000 Encounter for general adult medical examination without abnormal findings: Secondary | ICD-10-CM | POA: Diagnosis not present

## 2019-11-21 LAB — CBC WITH DIFFERENTIAL/PLATELET
Basophils Absolute: 0.1 10*3/uL (ref 0.0–0.2)
Basos: 1 %
EOS (ABSOLUTE): 0.3 10*3/uL (ref 0.0–0.4)
Eos: 3 %
Hematocrit: 43.2 % (ref 37.5–51.0)
Hemoglobin: 14.7 g/dL (ref 13.0–17.7)
Immature Grans (Abs): 0 10*3/uL (ref 0.0–0.1)
Immature Granulocytes: 0 %
Lymphocytes Absolute: 2 10*3/uL (ref 0.7–3.1)
Lymphs: 25 %
MCH: 30.2 pg (ref 26.6–33.0)
MCHC: 34 g/dL (ref 31.5–35.7)
MCV: 89 fL (ref 79–97)
Monocytes Absolute: 0.8 10*3/uL (ref 0.1–0.9)
Monocytes: 9 %
Neutrophils Absolute: 5.1 10*3/uL (ref 1.4–7.0)
Neutrophils: 62 %
Platelets: 229 10*3/uL (ref 150–450)
RBC: 4.86 x10E6/uL (ref 4.14–5.80)
RDW: 13.3 % (ref 11.6–15.4)
WBC: 8.2 10*3/uL (ref 3.4–10.8)

## 2019-11-21 LAB — CMP14+EGFR
ALT: 28 IU/L (ref 0–44)
AST: 27 IU/L (ref 0–40)
Albumin/Globulin Ratio: 2.1 (ref 1.2–2.2)
Albumin: 4.6 g/dL (ref 3.6–4.6)
Alkaline Phosphatase: 63 IU/L (ref 48–121)
BUN/Creatinine Ratio: 18 (ref 10–24)
BUN: 16 mg/dL (ref 8–27)
Bilirubin Total: 0.6 mg/dL (ref 0.0–1.2)
CO2: 22 mmol/L (ref 20–29)
Calcium: 9.4 mg/dL (ref 8.6–10.2)
Chloride: 95 mmol/L — ABNORMAL LOW (ref 96–106)
Creatinine, Ser: 0.9 mg/dL (ref 0.76–1.27)
GFR calc Af Amer: 92 mL/min/{1.73_m2} (ref >59)
GFR calc non Af Amer: 79 mL/min/{1.73_m2} (ref >59)
Globulin, Total: 2.2 g/dL (ref 1.5–4.5)
Glucose: 117 mg/dL — ABNORMAL HIGH (ref 65–99)
Potassium: 4.8 mmol/L (ref 3.5–5.2)
Sodium: 136 mmol/L (ref 134–144)
Total Protein: 6.8 g/dL (ref 6.0–8.5)

## 2019-11-21 LAB — PSA TOTAL (REFLEX TO FREE): Prostate Specific Ag, Serum: 1.3 ng/mL (ref 0.0–4.0)

## 2019-11-21 LAB — LIPID PANEL
Chol/HDL Ratio: 5.7 ratio — ABNORMAL HIGH (ref 0.0–5.0)
Cholesterol, Total: 227 mg/dL — ABNORMAL HIGH (ref 100–199)
HDL: 40 mg/dL (ref >39)
LDL Chol Calc (NIH): 172 mg/dL — ABNORMAL HIGH (ref 0–99)
Triglycerides: 82 mg/dL (ref 0–149)
VLDL Cholesterol Cal: 15 mg/dL (ref 5–40)

## 2019-11-21 LAB — VITAMIN B12: Vitamin B-12: >2000 pg/mL — ABNORMAL HIGH (ref 232–1245)

## 2019-11-21 LAB — TSH: TSH: 2.01 u[IU]/mL (ref 0.450–4.500)

## 2019-11-21 LAB — VITAMIN D 25 HYDROXY (VIT D DEFICIENCY, FRACTURES): Vit D, 25-Hydroxy: 40.6 ng/mL (ref 30.0–100.0)

## 2019-11-21 NOTE — Patient Instructions (Signed)
  [place] Maintenance Summary and Written Plan of Care  Mr. Aaron Whitney ,  Thank you for allowing me to perform your Medicare Annual Wellness Visit and for your ongoing commitment to your health.   Health Maintenance & Immunization History Health Maintenance  Topic Date Due  . INFLUENZA VACCINE  01/14/2020  . TETANUS/TDAP  12/22/2022  . COVID-19 Vaccine  Completed  . PNA vac Low Risk Adult  Completed   Immunization History  Administered Date(s) Administered  . Janssen (J&J) SARS-COV-2 Vaccination 08/27/2019  . Pneumococcal Conjugate-13 09/04/2014  . Pneumococcal Polysaccharide-23 12/13/2012, 10/05/2017    These are the patient goals that we discussed: Goals Addressed            This Visit's Progress   . Blood Pressure < 140/90   137/80   . DIET - REDUCE SALT INTAKE TO 2 GRAMS PER DAY OR LESS          This is a list of Health Maintenance Items that are overdue or due now: There are no preventive care reminders to display for this patient.   Orders/Referrals Placed Today: No orders of the defined types were placed in this encounter.  (Contact our referral department at [phone] if you have not spoken with someone about your referral appointment within the next 5 days)    Follow-up Plan  Scheduled with Dr. Livia Snellen 05/21/2020 at 10:25am

## 2019-11-21 NOTE — Progress Notes (Signed)
MEDICARE ANNUAL WELLNESS VISIT  11/21/2019  Telephone Visit Disclaimer This Medicare AWV was conducted by telephone due to national recommendations for restrictions regarding the COVID-19 Pandemic (e.g. social distancing).  I verified, using two identifiers, that I am speaking with Aaron Whitney or their authorized healthcare agent. I discussed the limitations, risks, security, and privacy concerns of performing an evaluation and management service by telephone and the potential availability of an in-person appointment in the future. The patient expressed understanding and agreed to proceed.   Subjective:  Aaron Whitney is a 84 y.o. male patient of Stacks, Cletus Gash, MD who had a Medicare Annual Wellness Visit today via telephone. Aaron Whitney is Retired and lives with their spouse. he has 4 children. he reports that he is socially active and does interact with friends/family regularly. he is minimally physically active and enjoys physical activity and internet research.  Patient Care Team: Claretta Fraise, MD as PCP - General (Family Medicine)  Advanced Directives 11/21/2019  Does Patient Have a Medical Advance Directive? Yes  Type of Paramedic of [place];Living will  Does patient want to make changes to medical advance directive? No - Patient declined  Would patient like information on creating a medical advance directive? No - Patient declined    Hospital Utilization Over the Past 12 Months: # of hospitalizations or ER visits: 0 # of surgeries: 0  Review of Systems    Patient reports that his overall health is unchanged compared to last year.    Patient Reported Readings (BP-137/80, Pulse, CBG, Weight, etc)   Pain Assessment Pain : No/denies pain     Current Medications & Allergies (verified) Allergies as of 11/21/2019   No Known Allergies     Medication List       Accurate as of November 21, 2019  1:36 PM. If you have any questions, ask  your nurse or doctor.        amLODipine 10 MG tablet Commonly known as: NORVASC Take 1 tablet (10 mg total) by mouth daily.   carvedilol 6.25 MG tablet Commonly known as: COREG Take 1 tablet (6.25 mg total) by mouth 2 (two) times daily with a meal.   Osteo Bi-Flex Triple Strength Tabs Take 1 tablet by mouth 2 (two) times daily.   tamsulosin 0.4 MG Caps capsule Commonly known as: FLOMAX Take 2 capsules (0.8 mg total) by mouth daily.       History (reviewed): Past Medical History:  Diagnosis Date  . Cancer [facility]) 2013   prostate  . Hypertension   . Macular degeneration    Past Surgical History:  Procedure Laterality Date  . PROSTATE SURGERY     Family History  Problem Relation Age of Onset  . Cancer Mother   . Alcohol abuse Father    Social History   Socioeconomic History  . Marital status: Married    Spouse name: Not on file  . Number of children: Not on file  . Years of education: Not on file  . Highest education level: Not on file  Occupational History  . Not on file  Tobacco Use  . Smoking status: Never Smoker  . Smokeless tobacco: Never Used  Substance and Sexual Activity  . Alcohol use: No    Alcohol/week: 0.0 standard drinks  . Drug use: No  . Sexual activity: Not on file  Other Topics Concern  . Not on file  Social History Narrative  . Not on file   Social Determinants of  Health   Financial Resource Strain:   . Difficulty of Paying Living Expenses:   Food Insecurity:   . Worried About Charity fundraiser in the Last Year:   . Arboriculturist in the Last Year:   Transportation Needs:   . Film/video editor (Medical):   Marland Kitchen Lack of Transportation (Non-Medical):   Physical Activity:   . Days of Exercise per Week:   . Minutes of Exercise per Session:   Stress:   . Feeling of Stress :   Social Connections:   . Frequency of Communication with Friends and Family:   . Frequency of Social Gatherings with Friends and Family:   . Attends  Religious Services:   . Active Member of Clubs or Organizations:   . Attends Archivist Meetings:   Marland Kitchen Marital Status:     Activities of Daily Living In your present state of health, do you have any difficulty performing the following activities: 11/21/2019  Hearing? N  Vision? N  Difficulty concentrating or making decisions? N  Walking or climbing stairs? N  Dressing or bathing? N  Doing errands, shopping? N  Preparing Food and eating ? N  Using the Toilet? N  In the past six months, have you accidently leaked urine? N  Do you have problems with loss of bowel control? N  Managing your Medications? N  Managing your Finances? N  Housekeeping or managing your Housekeeping? N  Some recent data might be hidden    Patient Education/ Literacy How often do you need to have someone help you when you read instructions, pamphlets, or other written materials from your doctor or pharmacy?: 1 - Never What is the last grade level you completed in school?: College  Exercise Current Exercise Habits: Structured exercise class, Type of exercise: walking, Time (Minutes): 30, Frequency (Times/Week): 3, Weekly Exercise (Minutes/Week): 90, Intensity: Mild  Diet Patient reports consuming 3 meals a day and 2 snack(s) a day Patient reports that his primary diet is: Low Sodium Patient reports that she does have regular access to food.   Depression Screen PHQ 2/9 Scores 11/21/2019 11/20/2019 10/25/2018 04/06/2018 10/05/2017 04/06/2017 09/23/2016  PHQ - 2 Score 0 0 0 0 0 0 0  PHQ- 9 Score - - 0 - - - -     Fall Risk Fall Risk  11/21/2019 11/20/2019 10/25/2018 04/06/2018 10/05/2017  Falls in the past year? 0 0 1 No Yes  Number falls in past yr: - - 0 - 1  Injury with Fall? - - 1 - No  Risk for fall due to : - - - - Other (Comment)  Follow up - Falls evaluation completed - - -     Objective:  Aaron Whitney seemed alert and oriented and he participated appropriately during our telephone  visit.  Blood Pressure Weight BMI  BP Readings from Last 3 Encounters:  11/21/19 137/80  11/20/19 130/75  10/25/18 138/88   Wt Readings from Last 3 Encounters:  11/20/19 186 lb 6 oz (84.5 kg)  10/25/18 182 lb (82.6 kg)  04/06/18 179 lb (81.2 kg)   BMI Readings from Last 1 Encounters:  11/20/19 29.19 kg/m    *Unable to obtain current vital signs, weight, and BMI due to telephone visit type  Hearing/Vision  . Ean did not seem to have difficulty with hearing/understanding during the telephone conversation . Reports that he has not had a formal eye exam by an eye care professional within the past year .  Reports that he has not had a formal hearing evaluation within the past year *Unable to fully assess hearing and vision during telephone visit type  Cognitive Function: 6CIT Screen 11/21/2019  What Year? 0 points  What month? 0 points  What time? 0 points  Count back from 20 0 points  Months in reverse 0 points  Repeat phrase 2 points  Total Score 2   (Normal:0-7, Significant for Dysfunction: >8)  Normal Cognitive Function Screening: Yes   Immunization & Health Maintenance Record Immunization History  Administered Date(s) Administered  . Janssen (J&J) SARS-COV-2 Vaccination 08/27/2019  . Pneumococcal Conjugate-13 09/04/2014  . Pneumococcal Polysaccharide-23 12/13/2012, 10/05/2017    Health Maintenance  Topic Date Due  . INFLUENZA VACCINE  01/14/2020  . TETANUS/TDAP  12/22/2022  . COVID-19 Vaccine  Completed  . PNA vac Low Risk Adult  Completed       Assessment  This is a routine wellness examination for JULYAN GALES.  Health Maintenance: Due or Overdue There are no preventive care reminders to display for this patient.  Aaron Whitney does not need a referral for [employer] Assistance: Care Management:   no Social Work:    no Prescription Assistance:  no Nutrition/Diabetes Education:  no   Plan:  Personalized Goals Goals Addressed             This Visit's Progress   . Blood Pressure < 140/90   137/80   . DIET - REDUCE SALT INTAKE TO 2 GRAMS PER DAY OR LESS        Personalized Health Maintenance & Screening Recommendations    Lung Cancer Screening Recommended: no (Low Dose CT Chest recommended if Age 71-80 years, 30 pack-year currently smoking OR have quit w/in past 15 years) Hepatitis C Screening recommended: no HIV Screening recommended: no  Advanced Directives: Written information was not prepared per patient's request.  Referrals & Orders No orders of the defined types were placed in this encounter.   Follow-up Plan . Follow-up with Claretta Fraise, MD as planned . Schedule 05/21/2020    I have personally reviewed and noted the following in the patient's chart:   . Medical and social history . Use of alcohol, tobacco or illicit drugs  . Current medications and supplements . Functional ability and status . Nutritional status . Physical activity . Advanced directives . List of other physicians . Hospitalizations, surgeries, and ER visits in previous 12 months . Vitals . Screenings to include cognitive, depression, and falls . Referrals and appointments  In addition, I have reviewed and discussed with Aaron Whitney certain preventive protocols, quality metrics, and best practice recommendations. A written personalized care plan for preventive services as well as general preventive health recommendations is available and can be mailed to the patient at his request.      Maud Deed [facility]  10/17/979

## 2020-05-13 ENCOUNTER — Other Ambulatory Visit: Payer: Self-pay | Admitting: Family Medicine

## 2020-05-21 ENCOUNTER — Encounter: Payer: Self-pay | Admitting: Family Medicine

## 2020-05-21 ENCOUNTER — Other Ambulatory Visit: Payer: Self-pay

## 2020-05-21 ENCOUNTER — Ambulatory Visit: Payer: Medicare Other | Admitting: Family Medicine

## 2020-05-21 VITALS — BP 135/77 | HR 82 | Temp 97.4°F | Resp 20 | Ht 67.0 in | Wt 187.5 lb

## 2020-05-21 DIAGNOSIS — E782 Mixed hyperlipidemia: Secondary | ICD-10-CM

## 2020-05-21 DIAGNOSIS — M4716 Other spondylosis with myelopathy, lumbar region: Secondary | ICD-10-CM | POA: Diagnosis not present

## 2020-05-21 DIAGNOSIS — M8949 Other hypertrophic osteoarthropathy, multiple sites: Secondary | ICD-10-CM | POA: Diagnosis not present

## 2020-05-21 DIAGNOSIS — I10 Essential (primary) hypertension: Secondary | ICD-10-CM | POA: Diagnosis not present

## 2020-05-21 DIAGNOSIS — R351 Nocturia: Secondary | ICD-10-CM

## 2020-05-21 DIAGNOSIS — N401 Enlarged prostate with lower urinary tract symptoms: Secondary | ICD-10-CM

## 2020-05-21 DIAGNOSIS — M159 Polyosteoarthritis, unspecified: Secondary | ICD-10-CM

## 2020-05-21 MED ORDER — ROSUVASTATIN CALCIUM 5 MG PO TABS: 5.0000 mg | ORAL_TABLET | Freq: Every day | ORAL | 3 refills | Status: DC

## 2020-05-21 MED ORDER — TAMSULOSIN HCL 0.4 MG PO CAPS: 0.8000 mg | ORAL_CAPSULE | Freq: Every day | ORAL | 1 refills | Status: DC

## 2020-05-21 MED ORDER — CARVEDILOL 6.25 MG PO TABS: ORAL_TABLET | ORAL | 1 refills | Status: DC

## 2020-05-21 NOTE — Progress Notes (Signed)
Subjective:  Patient ID: Aaron Whitney, male    DOB: 1936-04-09  Age: 84 y.o. MRN: [id number]  CC: Medical Management of Chronic Issues   HPI Aaron Whitney presents for  follow-up of hypertension. Patient has no history of headache chest pain or shortness of breath or recent cough. Patient also denies symptoms of TIA such as focal numbness or weakness. Patient denies side effects from medication. States taking it regularly.   History Aaron Whitney has a past medical history of Cancer ([place]) (2013), Hypertension, and Macular degeneration.   He has a past surgical history that includes Prostate surgery.   His family history includes Alcohol abuse in his father; Cancer in his mother.He reports that he has never smoked. He has never used smokeless tobacco. He reports that he does not drink alcohol and does not use drugs.  Current Outpatient Medications on File Prior to Visit  Medication Sig Dispense Refill  . amLODipine (NORVASC) 10 MG tablet Take 1 tablet (10 mg total) by mouth daily. 90 tablet 3  . Misc Natural Products (OSTEO BI-FLEX TRIPLE STRENGTH) TABS Take 1 tablet by mouth 2 (two) times daily. 1 tablet 1   No current facility-administered medications on file prior to visit.    ROS Review of Systems  Constitutional: Negative for fever.  Respiratory: Negative for shortness of breath.   Cardiovascular: Negative for chest pain.  Gastrointestinal: Negative.        Fber helping with bowels  Genitourinary: Positive for frequency (nocturia four times nightly).  Musculoskeletal: Negative for arthralgias.  Skin: Negative for rash.    Objective:  BP 135/77   Pulse 82   Temp (!) 97.4 F (36.3 C) (Temporal)   Resp 20   Ht '5\' 7"'  (1.702 m)   Wt 187 lb 8 oz (85 kg)   SpO2 95%   BMI 29.37 kg/m   BP Readings from Last 3 Encounters:  05/21/20 135/77  11/21/19 137/80  11/20/19 130/75    Wt Readings from Last 3 Encounters:  05/21/20 187 lb 8 oz (85 kg)  11/20/19  186 lb 6 oz (84.5 kg)  10/25/18 182 lb (82.6 kg)     Physical Exam Vitals reviewed.  Constitutional:      Appearance: He is well-developed and well-nourished.  HENT:     Head: Normocephalic and atraumatic.     Right Ear: Tympanic membrane and external ear normal. No decreased hearing noted.     Left Ear: Tympanic membrane and external ear normal. No decreased hearing noted.     Mouth/Throat:     Pharynx: No oropharyngeal exudate or posterior oropharyngeal erythema.  Eyes:     Pupils: Pupils are equal, round, and reactive to light.  Cardiovascular:     Rate and Rhythm: Normal rate and regular rhythm.     Heart sounds: No murmur heard.   Pulmonary:     Effort: No respiratory distress.     Breath sounds: Normal breath sounds.  Abdominal:     General: Bowel sounds are normal.     Palpations: Abdomen is soft. There is no mass.     Tenderness: There is no abdominal tenderness.  Musculoskeletal:     Cervical back: Normal range of motion and neck supple.       Assessment & Plan:   Aaron Whitney was seen today for medical management of chronic issues.  Diagnoses and all orders for this visit:  Primary osteoarthritis involving multiple joints -     CBC with Differential/Platelet -  CMP14+EGFR  Essential hypertension -     carvedilol (COREG) 6.25 MG tablet; TAKE ONE TABLET BY MOUTH TWICE DAILY WITH MEAL -     CBC with Differential/Platelet -     CMP14+EGFR  Lumbar spondylosis with myelopathy -     CBC with Differential/Platelet -     CMP14+EGFR  Mixed hyperlipidemia -     CBC with Differential/Platelet -     CMP14+EGFR -     rosuvastatin (CRESTOR) 5 MG tablet; Take 1 tablet (5 mg total) by mouth daily. For cholesterol  Benign prostatic hyperplasia with nocturia -     tamsulosin (FLOMAX) 0.4 MG CAPS capsule; Take 2 capsules (0.8 mg total) by mouth daily.  Other orders -     Discontinue: tamsulosin (FLOMAX) 0.4 MG CAPS capsule; Take 2 capsules (0.8 mg total) by mouth  daily.   Allergies as of 05/21/2020   No Known Allergies     Medication List       Accurate as of May 21, 2020 11:59 PM. If you have any questions, ask your nurse or doctor.        amLODipine 10 MG tablet Commonly known as: NORVASC Take 1 tablet (10 mg total) by mouth daily.   carvedilol 6.25 MG tablet Commonly known as: COREG TAKE ONE TABLET BY MOUTH TWICE DAILY WITH MEAL   Osteo Bi-Flex Triple Strength Tabs Take 1 tablet by mouth 2 (two) times daily.   rosuvastatin 5 MG tablet Commonly known as: Crestor Take 1 tablet (5 mg total) by mouth daily. For cholesterol Started by: Claretta Fraise, MD   tamsulosin 0.4 MG Caps capsule Commonly known as: FLOMAX Take 2 capsules (0.8 mg total) by mouth daily.       Meds ordered this encounter  Medications  . DISCONTD: tamsulosin (FLOMAX) 0.4 MG CAPS capsule    Sig: Take 2 capsules (0.8 mg total) by mouth daily.    Dispense:  180 capsule    Refill:  1  . tamsulosin (FLOMAX) 0.4 MG CAPS capsule    Sig: Take 2 capsules (0.8 mg total) by mouth daily.    Dispense:  180 capsule    Refill:  1  . carvedilol (COREG) 6.25 MG tablet    Sig: TAKE ONE TABLET BY MOUTH TWICE DAILY WITH MEAL    Dispense:  180 tablet    Refill:  1  . rosuvastatin (CRESTOR) 5 MG tablet    Sig: Take 1 tablet (5 mg total) by mouth daily. For cholesterol    Dispense:  90 tablet    Refill:  3     Follow-up: Return in about 6 months (around 11/19/2020) for [facility] physical.  Claretta Fraise, M.D.

## 2020-05-22 LAB — CMP14+EGFR
ALT: 36 IU/L (ref 0–44)
AST: 29 IU/L (ref 0–40)
Albumin/Globulin Ratio: 1.7 (ref 1.2–2.2)
Albumin: 4.7 g/dL — ABNORMAL HIGH (ref 3.6–4.6)
Alkaline Phosphatase: 70 IU/L (ref 44–121)
BUN/Creatinine Ratio: 15 (ref 10–24)
BUN: 18 mg/dL (ref 8–27)
Bilirubin Total: 0.6 mg/dL (ref 0.0–1.2)
CO2: 26 mmol/L (ref 20–29)
Calcium: 9.9 mg/dL (ref 8.6–10.2)
Chloride: 99 mmol/L (ref 96–106)
Creatinine, Ser: 1.17 mg/dL (ref 0.76–1.27)
GFR calc Af Amer: 67 mL/min/{1.73_m2} (ref >59)
GFR calc non Af Amer: 58 mL/min/{1.73_m2} — ABNORMAL LOW (ref >59)
Globulin, Total: 2.7 g/dL (ref 1.5–4.5)
Glucose: 121 mg/dL — ABNORMAL HIGH (ref 65–99)
Potassium: 4.8 mmol/L (ref 3.5–5.2)
Sodium: 138 mmol/L (ref 134–144)
Total Protein: 7.4 g/dL (ref 6.0–8.5)

## 2020-05-22 LAB — CBC WITH DIFFERENTIAL/PLATELET
Basophils Absolute: 0 10*3/uL (ref 0.0–0.2)
Basos: 1 %
EOS (ABSOLUTE): 0.2 10*3/uL (ref 0.0–0.4)
Eos: 2 %
Hematocrit: 44.5 % (ref 37.5–51.0)
Hemoglobin: 15.8 g/dL (ref 13.0–17.7)
Immature Grans (Abs): 0 10*3/uL (ref 0.0–0.1)
Immature Granulocytes: 0 %
Lymphocytes Absolute: 2.4 10*3/uL (ref 0.7–3.1)
Lymphs: 28 %
MCH: 31.5 pg (ref 26.6–33.0)
MCHC: 35.5 g/dL (ref 31.5–35.7)
MCV: 89 fL (ref 79–97)
Monocytes Absolute: 0.8 10*3/uL (ref 0.1–0.9)
Monocytes: 10 %
Neutrophils Absolute: 5.1 10*3/uL (ref 1.4–7.0)
Neutrophils: 59 %
Platelets: 220 10*3/uL (ref 150–450)
RBC: 5.01 x10E6/uL (ref 4.14–5.80)
RDW: 12.9 % (ref 11.6–15.4)
WBC: 8.7 10*3/uL (ref 3.4–10.8)

## 2020-05-24 ENCOUNTER — Encounter: Payer: Self-pay | Admitting: Family Medicine

## 2020-05-24 NOTE — Progress Notes (Signed)
Hello Drury,  Your lab result is normal and/or stable.Some minor variations that are not significant are commonly marked abnormal, but do not represent any medical problem for you.  Best regards, Claretta Fraise, M.D.

## 2020-07-08 ENCOUNTER — Telehealth: Payer: Self-pay

## 2020-07-08 NOTE — Telephone Encounter (Signed)
If he is better, have him take an aspirin 325 daioy and follow up with me in a week. Go to hospital if it happens again.

## 2020-07-08 NOTE — Telephone Encounter (Signed)
Pt had an episode this morning when he first woke up and set on the side of the bed. He then was off balance when he stood up to walk. This episode lasted a few hours. His BP was 130/80 and his heart rate was 68 per wife. He feels better now and hasn't had any further episodes. Does pt ntbs? Please advise

## 2020-07-09 NOTE — Telephone Encounter (Signed)
Spoke with wife and informed her patient to take 325 mg aspirin and see Dr. Livia Snellen in one week.  Appointment scheduled 07/16/20 at 10:10 am.

## 2020-07-09 NOTE — Telephone Encounter (Signed)
Left message to call back  

## 2020-07-09 NOTE — Telephone Encounter (Signed)
Rc for nurse 

## 2020-07-16 ENCOUNTER — Other Ambulatory Visit: Payer: Self-pay

## 2020-07-16 ENCOUNTER — Ambulatory Visit (INDEPENDENT_AMBULATORY_CARE_PROVIDER_SITE_OTHER): Payer: Medicare Other | Admitting: Family Medicine

## 2020-07-16 ENCOUNTER — Encounter: Payer: Self-pay | Admitting: Family Medicine

## 2020-07-16 VITALS — BP 142/81 | HR 73 | Temp 97.6°F | Ht 67.0 in | Wt 191.0 lb

## 2020-07-16 DIAGNOSIS — I951 Orthostatic hypotension: Secondary | ICD-10-CM | POA: Diagnosis not present

## 2020-07-16 DIAGNOSIS — I10 Essential (primary) hypertension: Secondary | ICD-10-CM | POA: Diagnosis not present

## 2020-07-16 MED ORDER — CARVEDILOL 3.125 MG PO TABS: 3.1250 mg | ORAL_TABLET | Freq: Two times a day (BID) | ORAL | 2 refills | Status: DC

## 2020-07-16 NOTE — Patient Instructions (Signed)
Decrease your dose of carvedilol as follows.  Take one half of a 6.25 mg tablet each morning and a whole 6.2 for half tablet each evening for 1 week. Then start taking one half of a 6.25 every morning and every evening.  When you run out of these, you may start taking the new strength, 3.125 mg 1 tablet twice daily.

## 2020-07-16 NOTE — Progress Notes (Signed)
Subjective:  Patient ID: Aaron Whitney, male    DOB: [birth year]  Age: 85 y.o. MRN: [id number]  CC: Follow-up (1 week-)   HPI Aaron Whitney presents for dizziness that has continued through this week.  He woke up a week ago yesterday and got up walked across the room and started feeling lightheaded dizzy.  He laid down and had countless swimmy feeling of things moving slightly.  He notes that when he sitting down usually there is no dizziness when he stands up he gets dizzy.  This has continued throughout the week with the exception of the laying down and feeling things moving.  That has gone away.  He does have some posterior neck pain and wonders if that might be contributing.  Aaron Whitney also tells me that he is considering starting the Crestor but has not done that yet.  He is concerned also about his heart rate if he has to get back on blood pressure medicine he wonders if that will cause his heart rate to go too high.  Decrease your Coreg/carvedilol as follows:  Depression screen [facility] 2/9 07/16/2020 05/21/2020 11/21/2019  Decreased Interest 0 0 0  Down, Depressed, Hopeless 0 0 0  PHQ - 2 Score 0 0 0  Altered sleeping - - -  Tired, decreased energy - - -  Change in appetite - - -  Feeling bad or failure about yourself  - - -  Trouble concentrating - - -  Moving slowly or fidgety/restless - - -  Suicidal thoughts - - -  PHQ-9 Score - - -    History Aaron Whitney has a past medical history of Cancer ([place]) (2013), Hypertension, and Macular degeneration.   He has a past surgical history that includes Prostate surgery.   His family history includes Alcohol abuse in his father; Cancer in his mother.He reports that he has never smoked. He has never used smokeless tobacco. He reports that he does not drink alcohol and does not use drugs.    ROS Review of Systems  Constitutional: Negative for fever.  Respiratory: Negative for shortness of breath.   Cardiovascular: Negative  for chest pain.  Musculoskeletal: Negative for arthralgias.  Skin: Negative for rash.    Objective:  BP (!) 142/81   Pulse 73   Temp 97.6 F (36.4 C) (Temporal)   Ht 5\' 7"  (1.702 m)   Wt 191 lb (86.6 kg)   BMI 29.91 kg/m   BP Readings from Last 3 Encounters:  07/16/20 (!) 142/81  05/21/20 135/77  11/21/19 137/80   Orthostatic VS for the past 24 hrs:  BP- Lying Pulse- Lying BP- Sitting Pulse- Sitting BP- Standing at 0 minutes Pulse- Standing at 0 minutes  07/16/20 2010 142/81 73 134/75 74 -- --  07/16/20 1004 142/81 73 134/75 74 114/67 (!) 3      Wt Readings from Last 3 Encounters:  07/16/20 191 lb (86.6 kg)  05/21/20 187 lb 8 oz (85 kg)  11/20/19 186 lb 6 oz (84.5 kg)     Physical Exam Vitals reviewed.  Constitutional:      Appearance: He is well-developed and well-nourished.  HENT:     Head: Normocephalic and atraumatic.     Right Ear: External ear normal.     Left Ear: External ear normal.     Mouth/Throat:     Pharynx: No oropharyngeal exudate or posterior oropharyngeal erythema.  Eyes:     Pupils: Pupils are equal, round, and reactive to light.  Cardiovascular:     Rate and Rhythm: Normal rate and regular rhythm.     Heart sounds: No murmur heard.   Pulmonary:     Effort: No respiratory distress.     Breath sounds: Normal breath sounds.  Musculoskeletal:     Cervical back: Normal range of motion and neck supple.  Neurological:     Mental Status: He is alert and oriented to person, place, and time.       Assessment & Plan:   Aaron Whitney was seen today for follow-up.  Diagnoses and all orders for this visit:  Orthostasis  Essential hypertension -     carvedilol (COREG) 3.125 MG tablet; Take 1 tablet (3.125 mg total) by mouth 2 (two) times daily with a meal.       I have changed Aaron Whitney. Aaron Whitney carvedilol. I am also having him maintain his Osteo Bi-Flex Triple Strength, amLODipine, tamsulosin, and  rosuvastatin.  Allergies as of 07/16/2020   No Known Allergies     Medication List       Accurate as of July 16, 2020  8:20 PM. If you have any questions, ask your nurse or doctor.        amLODipine 10 MG tablet Commonly known as: NORVASC Take 1 tablet (10 mg total) by mouth daily.   carvedilol 3.125 MG tablet Commonly known as: COREG Take 1 tablet (3.125 mg total) by mouth 2 (two) times daily with a meal. What changed:   medication strength  how much to take  how to take this  when to take this  additional instructions Changed by: Claretta Fraise, MD   Osteo Bi-Flex Triple Strength Tabs Take 1 tablet by mouth 2 (two) times daily.   rosuvastatin 5 MG tablet Commonly known as: Crestor Take 1 tablet (5 mg total) by mouth daily. For cholesterol   tamsulosin 0.4 MG Caps capsule Commonly known as: FLOMAX Take 2 capsules (0.8 mg total) by mouth daily.        Follow-up: Return in about 2 weeks (around 07/30/2020).  Claretta Fraise, M.D.

## 2020-07-31 ENCOUNTER — Encounter: Payer: Self-pay | Admitting: Family Medicine

## 2020-07-31 ENCOUNTER — Ambulatory Visit (INDEPENDENT_AMBULATORY_CARE_PROVIDER_SITE_OTHER): Payer: Medicare Other | Admitting: Family Medicine

## 2020-07-31 ENCOUNTER — Other Ambulatory Visit: Payer: Self-pay

## 2020-07-31 VITALS — BP 134/83 | HR 76 | Temp 97.9°F | Resp 20 | Ht 67.0 in | Wt 193.1 lb

## 2020-07-31 DIAGNOSIS — I951 Orthostatic hypotension: Secondary | ICD-10-CM | POA: Diagnosis not present

## 2020-07-31 DIAGNOSIS — I10 Essential (primary) hypertension: Secondary | ICD-10-CM | POA: Diagnosis not present

## 2020-07-31 NOTE — Progress Notes (Signed)
Subjective:  Patient ID: Aaron Whitney, male    DOB: Dec 11, 1935  Age: 85 y.o. MRN: [id number]  CC: Two week follow up   HPI Aaron Whitney presents for follow-up on recent orthostatic hypotension.  Dizziness he was experiencing has resolved.  He has been monitoring his blood pressure daily at home since switching to the lower dose of Coreg and his blood pressure is staying under good control.  He is heart rate is staying on the 70s mostly.  He notes one time he got up to 90.  He has not had any palpitations or chest pain. Depression screen [facility] 2/9 07/16/2020 05/21/2020 11/21/2019  Decreased Interest 0 0 0  Down, Depressed, Hopeless 0 0 0  PHQ - 2 Score 0 0 0  Altered sleeping - - -  Tired, decreased energy - - -  Change in appetite - - -  Feeling bad or failure about yourself  - - -  Trouble concentrating - - -  Moving slowly or fidgety/restless - - -  Suicidal thoughts - - -  PHQ-9 Score - - -    History Aaron Whitney has a past medical history of Cancer ([place]) (2013), Hypertension, and Macular degeneration.   He has a past surgical history that includes Prostate surgery.   His family history includes Alcohol abuse in his father; Cancer in his mother.He reports that he has never smoked. He has never used smokeless tobacco. He reports that he does not drink alcohol and does not use drugs.    ROS Review of Systems Noncontributory is except as noted in HPI Objective:  BP 134/83   Pulse 76   Temp 97.9 F (36.6 C) (Temporal)   Resp 20   Ht 5\' 7"  (1.702 m)   Wt 193 lb 2 oz (87.6 kg)   SpO2 96%   BMI 30.25 kg/m   BP Readings from Last 3 Encounters:  07/31/20 134/83  07/16/20 (!) 142/81  05/21/20 135/77    Wt Readings from Last 3 Encounters:  07/31/20 193 lb 2 oz (87.6 kg)  07/16/20 191 lb (86.6 kg)  05/21/20 187 lb 8 oz (85 kg)     Physical Exam Vitals reviewed.  Constitutional:      Appearance: He is well-developed and well-nourished.  HENT:     Head:  Normocephalic and atraumatic.     Right Ear: External ear normal.     Left Ear: External ear normal.     Mouth/Throat:     Pharynx: No oropharyngeal exudate or posterior oropharyngeal erythema.  Eyes:     Pupils: Pupils are equal, round, and reactive to light.  Cardiovascular:     Rate and Rhythm: Normal rate and regular rhythm.     Heart sounds: No murmur heard.   Pulmonary:     Effort: No respiratory distress.     Breath sounds: Normal breath sounds.  Musculoskeletal:     Cervical back: Normal range of motion and neck supple.  Neurological:     Mental Status: He is alert and oriented to person, place, and time.       Assessment & Plan:   Aaron Whitney was seen today for two week follow up.  Diagnoses and all orders for this visit:  Orthostasis  Essential hypertension       I am having Aaron Poot. [place] "[name]" maintain his Osteo Bi-Flex Triple Strength, amLODipine, tamsulosin, rosuvastatin, and carvedilol.  Allergies as of 07/31/2020   No Known Allergies     Medication List  Accurate as of July 31, 2020  9:11 PM. If you have any questions, ask your nurse or doctor.        amLODipine 10 MG tablet Commonly known as: NORVASC Take 1 tablet (10 mg total) by mouth daily.   carvedilol 3.125 MG tablet Commonly known as: COREG Take 1 tablet (3.125 mg total) by mouth 2 (two) times daily with a meal.   Osteo Bi-Flex Triple Strength Tabs Take 1 tablet by mouth 2 (two) times daily.   rosuvastatin 5 MG tablet Commonly known as: Crestor Take 1 tablet (5 mg total) by mouth daily. For cholesterol   tamsulosin 0.4 MG Caps capsule Commonly known as: FLOMAX Take 2 capsules (0.8 mg total) by mouth daily.        Follow-up: Return in about 3 months (around 10/28/2020).  Claretta Fraise, M.D.

## 2020-10-10 ENCOUNTER — Other Ambulatory Visit: Payer: Self-pay | Admitting: Family Medicine

## 2020-10-10 DIAGNOSIS — I10 Essential (primary) hypertension: Secondary | ICD-10-CM

## 2020-11-19 ENCOUNTER — Ambulatory Visit (INDEPENDENT_AMBULATORY_CARE_PROVIDER_SITE_OTHER): Payer: Medicare Other | Admitting: Family Medicine

## 2020-11-19 ENCOUNTER — Encounter: Payer: Self-pay | Admitting: Family Medicine

## 2020-11-19 ENCOUNTER — Other Ambulatory Visit: Payer: Self-pay

## 2020-11-19 VITALS — BP 129/74 | HR 72 | Temp 97.8°F | Ht 67.0 in | Wt 187.2 lb

## 2020-11-19 DIAGNOSIS — N401 Enlarged prostate with lower urinary tract symptoms: Secondary | ICD-10-CM

## 2020-11-19 DIAGNOSIS — Z0001 Encounter for general adult medical examination with abnormal findings: Secondary | ICD-10-CM

## 2020-11-19 DIAGNOSIS — M150 Primary generalized (osteo)arthritis: Secondary | ICD-10-CM

## 2020-11-19 DIAGNOSIS — R002 Palpitations: Secondary | ICD-10-CM

## 2020-11-19 DIAGNOSIS — M8949 Other hypertrophic osteoarthropathy, multiple sites: Secondary | ICD-10-CM

## 2020-11-19 DIAGNOSIS — Z23 Encounter for immunization: Secondary | ICD-10-CM

## 2020-11-19 DIAGNOSIS — R351 Nocturia: Secondary | ICD-10-CM

## 2020-11-19 DIAGNOSIS — L249 Irritant contact dermatitis, unspecified cause: Secondary | ICD-10-CM

## 2020-11-19 DIAGNOSIS — I10 Essential (primary) hypertension: Secondary | ICD-10-CM | POA: Diagnosis not present

## 2020-11-19 DIAGNOSIS — E782 Mixed hyperlipidemia: Secondary | ICD-10-CM

## 2020-11-19 DIAGNOSIS — M159 Polyosteoarthritis, unspecified: Secondary | ICD-10-CM

## 2020-11-19 DIAGNOSIS — E559 Vitamin D deficiency, unspecified: Secondary | ICD-10-CM

## 2020-11-19 DIAGNOSIS — R5383 Other fatigue: Secondary | ICD-10-CM

## 2020-11-19 DIAGNOSIS — C61 Malignant neoplasm of prostate: Secondary | ICD-10-CM

## 2020-11-19 MED ORDER — TRIAMCINOLONE ACETONIDE 0.1 % EX CREA: 1.0000 "application " | TOPICAL_CREAM | Freq: Three times a day (TID) | CUTANEOUS | 0 refills | Status: DC

## 2020-11-19 NOTE — Progress Notes (Signed)
Subjective:  Patient ID: Aaron Whitney, male    DOB: 24-Feb-1936  Age: 85 y.o. MRN: [id number]  CC: Annual Exam   HPI Aaron Whitney presents for Multiple diagnoses including Ca prostate and recent palpitations. No dizziness syncope or chest pain associated.   Depression screen [facility] 2/9 11/19/2020 11/19/2020 07/16/2020  Decreased Interest 0 0 0  Down, Depressed, Hopeless 0 0 0  PHQ - 2 Score 0 0 0  Altered sleeping 0 - -  Tired, decreased energy 1 - -  Change in appetite 0 - -  Feeling bad or failure about yourself  0 - -  Trouble concentrating 0 - -  Moving slowly or fidgety/restless 0 - -  Suicidal thoughts 0 - -  PHQ-9 Score 1 - -  Difficult doing work/chores Not difficult at all - -    History Aaron Whitney has a past medical history of Cancer ([place]) (2013), Hypertension, and Macular degeneration.   He has a past surgical history that includes Prostate surgery.   His family history includes Alcohol abuse in his father; Cancer in his mother.He reports that he has never smoked. He has never used smokeless tobacco. He reports that he does not drink alcohol and does not use drugs.    ROS Review of Systems  Constitutional: Negative for activity change, fatigue and unexpected weight change.  HENT: Negative for congestion, ear pain, hearing loss, postnasal drip and trouble swallowing.   Eyes: Negative for pain and visual disturbance.  Respiratory: Negative for cough, chest tightness and shortness of breath.   Cardiovascular: Negative for chest pain, palpitations and leg swelling.  Gastrointestinal: Negative for abdominal distention, abdominal pain, blood in stool, constipation, diarrhea, nausea and vomiting.  Endocrine: Negative for cold intolerance, heat intolerance and polydipsia.  Genitourinary: Negative for difficulty urinating, dysuria, flank pain, frequency and urgency.  Musculoskeletal: Negative for arthralgias and joint swelling.  Skin: Negative for color change,  rash and wound.  Neurological: Negative for dizziness, syncope, speech difficulty, weakness, light-headedness, numbness and headaches.  Hematological: Does not bruise/bleed easily.  Psychiatric/Behavioral: Negative for confusion, decreased concentration, dysphoric mood and sleep disturbance. The patient is not nervous/anxious.     Objective:  BP 129/74   Pulse 72   Temp 97.8 F (36.6 C)   Ht '5\' 7"'  (1.702 m)   Wt 187 lb 3.2 oz (84.9 kg)   SpO2 96%   BMI 29.32 kg/m   BP Readings from Last 3 Encounters:  11/19/20 129/74  07/31/20 134/83  07/16/20 (!) 142/81    Wt Readings from Last 3 Encounters:  11/19/20 187 lb 3.2 oz (84.9 kg)  07/31/20 193 lb 2 oz (87.6 kg)  07/16/20 191 lb (86.6 kg)     Physical Exam Constitutional:      Appearance: He is well-developed.  HENT:     Head: Normocephalic and atraumatic.  Eyes:     Pupils: Pupils are equal, round, and reactive to light.  Neck:     Thyroid: No thyromegaly.     Trachea: No tracheal deviation.  Cardiovascular:     Rate and Rhythm: Normal rate and regular rhythm.     Heart sounds: Normal heart sounds. No murmur heard. No friction rub. No gallop.   Pulmonary:     Breath sounds: Normal breath sounds. No wheezing or rales.  Abdominal:     General: Bowel sounds are normal. There is no distension.     Palpations: Abdomen is soft. There is no mass.     Tenderness: There  is no abdominal tenderness.     Hernia: There is no hernia in the left inguinal area.  Genitourinary:    Penis: Normal.      Testes: Normal.  Musculoskeletal:        General: Normal range of motion.     Cervical back: Normal range of motion.  Lymphadenopathy:     Cervical: No cervical adenopathy.  Skin:    General: Skin is warm and dry.  Neurological:     Mental Status: He is alert and oriented to person, place, and time.       Assessment & Plan:   Aadarsh was seen today for annual exam.  Diagnoses and all orders for this  visit:  Essential hypertension -     CBC with Differential/Platelet -     CMP14+EGFR -     carvedilol (COREG) 3.125 MG tablet; Take 1 tablet (3.125 mg total) by mouth 2 (two) times daily with a meal.  Mixed hyperlipidemia -     Lipid panel -     rosuvastatin (CRESTOR) 5 MG tablet; Take 1 tablet (5 mg total) by mouth daily. For cholesterol  Benign prostatic hyperplasia with nocturia -     PSA, total and free -     tamsulosin (FLOMAX) 0.4 MG CAPS capsule; Take 2 capsules (0.8 mg total) by mouth daily.  Fatigue, unspecified type -     TSH -     Vitamin B12  Vitamin D deficiency -     VITAMIN D 25 Hydroxy (Vit-D Deficiency, Fractures)  CA of prostate (HCC) -     PSA, total and free  Primary osteoarthritis involving multiple joints  Irritant contact dermatitis, unspecified trigger -     triamcinolone cream (KENALOG) 0.1 %; Apply 1 application topically 3 (three) times daily. Avoid face and genitalia  Palpitations -     EKG 12-Lead  Need for pneumococcal vaccination -     Pneumococcal conjugate vaccine 13-valent       I have changed Aaron Poot. [place] "[name]"'s carvedilol. I am also having him start on triamcinolone cream. Additionally, I am having him maintain his Osteo Bi-Flex Triple Strength, amLODipine, rosuvastatin, and tamsulosin.  Allergies as of 11/19/2020   No Known Allergies     Medication List       Accurate as of November 19, 2020 11:59 PM. If you have any questions, ask your nurse or doctor.        amLODipine 10 MG tablet Commonly known as: NORVASC Take 1 tablet (10 mg total) by mouth daily.   carvedilol 3.125 MG tablet Commonly known as: COREG Take 1 tablet (3.125 mg total) by mouth 2 (two) times daily with a meal.   Osteo Bi-Flex Triple Strength Tabs Take 1 tablet by mouth 2 (two) times daily.   rosuvastatin 5 MG tablet Commonly known as: Crestor Take 1 tablet (5 mg total) by mouth daily. For cholesterol   tamsulosin 0.4 MG Caps  capsule Commonly known as: FLOMAX Take 2 capsules (0.8 mg total) by mouth daily.   triamcinolone cream 0.1 % Commonly known as: KENALOG Apply 1 application topically 3 (three) times daily. Avoid face and genitalia Started by: Claretta Fraise, MD        Follow-up: Return in about 6 months (around 05/21/2021), or if symptoms worsen or fail to improve.  Claretta Fraise, M.D.

## 2020-11-20 ENCOUNTER — Encounter: Payer: Self-pay | Admitting: Family Medicine

## 2020-11-20 LAB — LIPID PANEL
Chol/HDL Ratio: 5.1 ratio — ABNORMAL HIGH (ref 0.0–5.0)
Cholesterol, Total: 216 mg/dL — ABNORMAL HIGH (ref 100–199)
HDL: 42 mg/dL (ref >39)
LDL Chol Calc (NIH): 161 mg/dL — ABNORMAL HIGH (ref 0–99)
Triglycerides: 75 mg/dL (ref 0–149)
VLDL Cholesterol Cal: 13 mg/dL (ref 5–40)

## 2020-11-20 LAB — CBC WITH DIFFERENTIAL/PLATELET
Basophils Absolute: 0.1 10*3/uL (ref 0.0–0.2)
Basos: 1 %
EOS (ABSOLUTE): 0.3 10*3/uL (ref 0.0–0.4)
Eos: 4 %
Hematocrit: 45 % (ref 37.5–51.0)
Hemoglobin: 15.5 g/dL (ref 13.0–17.7)
Immature Grans (Abs): 0 10*3/uL (ref 0.0–0.1)
Immature Granulocytes: 0 %
Lymphocytes Absolute: 1.8 10*3/uL (ref 0.7–3.1)
Lymphs: 23 %
MCH: 30.8 pg (ref 26.6–33.0)
MCHC: 34.4 g/dL (ref 31.5–35.7)
MCV: 89 fL (ref 79–97)
Monocytes Absolute: 0.9 10*3/uL (ref 0.1–0.9)
Monocytes: 11 %
Neutrophils Absolute: 4.7 10*3/uL (ref 1.4–7.0)
Neutrophils: 61 %
Platelets: 231 10*3/uL (ref 150–450)
RBC: 5.04 x10E6/uL (ref 4.14–5.80)
RDW: 13.3 % (ref 11.6–15.4)
WBC: 7.8 10*3/uL (ref 3.4–10.8)

## 2020-11-20 LAB — CMP14+EGFR
ALT: 21 IU/L (ref 0–44)
AST: 20 IU/L (ref 0–40)
Albumin/Globulin Ratio: 1.9 (ref 1.2–2.2)
Albumin: 4.7 g/dL — ABNORMAL HIGH (ref 3.6–4.6)
Alkaline Phosphatase: 65 IU/L (ref 44–121)
BUN/Creatinine Ratio: 18 (ref 10–24)
BUN: 19 mg/dL (ref 8–27)
Bilirubin Total: 0.6 mg/dL (ref 0.0–1.2)
CO2: 20 mmol/L (ref 20–29)
Calcium: 9.7 mg/dL (ref 8.6–10.2)
Chloride: 98 mmol/L (ref 96–106)
Creatinine, Ser: 1.07 mg/dL (ref 0.76–1.27)
Globulin, Total: 2.5 g/dL (ref 1.5–4.5)
Glucose: 122 mg/dL — ABNORMAL HIGH (ref 65–99)
Potassium: 4.7 mmol/L (ref 3.5–5.2)
Sodium: 137 mmol/L (ref 134–144)
Total Protein: 7.2 g/dL (ref 6.0–8.5)
eGFR: 69 mL/min/{1.73_m2} (ref >59)

## 2020-11-20 LAB — PSA, TOTAL AND FREE
PSA, Free Pct: 9.4 %
PSA, Free: 0.16 ng/mL
Prostate Specific Ag, Serum: 1.7 ng/mL (ref 0.0–4.0)

## 2020-11-20 LAB — VITAMIN D 25 HYDROXY (VIT D DEFICIENCY, FRACTURES): Vit D, 25-Hydroxy: 50.3 ng/mL (ref 30.0–100.0)

## 2020-11-20 LAB — TSH: TSH: 2.05 u[IU]/mL (ref 0.450–4.500)

## 2020-11-20 LAB — VITAMIN B12: Vitamin B-12: 1360 pg/mL — ABNORMAL HIGH (ref 232–1245)

## 2020-11-20 MED ORDER — TAMSULOSIN HCL 0.4 MG PO CAPS: 0.8000 mg | ORAL_CAPSULE | Freq: Every day | ORAL | 1 refills | Status: DC

## 2020-11-20 MED ORDER — ROSUVASTATIN CALCIUM 5 MG PO TABS
5.0000 mg | ORAL_TABLET | Freq: Every day | ORAL | 3 refills | Status: DC
Start: 2020-11-20 — End: 2021-05-21

## 2020-11-20 MED ORDER — CARVEDILOL 3.125 MG PO TABS: 3.1250 mg | ORAL_TABLET | Freq: Two times a day (BID) | ORAL | 3 refills | Status: DC

## 2020-11-21 ENCOUNTER — Ambulatory Visit (INDEPENDENT_AMBULATORY_CARE_PROVIDER_SITE_OTHER): Payer: Medicare Other

## 2020-11-21 VITALS — Wt 187.0 lb

## 2020-11-21 DIAGNOSIS — Z0000 Encounter for general adult medical examination without abnormal findings: Secondary | ICD-10-CM | POA: Diagnosis not present

## 2020-11-21 NOTE — Progress Notes (Signed)
Subjective:   CEEJAY KEGLEY is a 85 y.o. male who presents for Medicare Annual/Subsequent preventive examination.  Virtual Visit via Telephone Note  I connected with  WYMAN MESCHKE on 11/21/20 at  1:15 PM EDT by telephone and verified that I am speaking with the correct person using two identifiers.  Location: Patient: Home Provider: [facility] Persons participating in the virtual visit: patient/Nurse Health Advisor   I discussed the limitations, risks, security and privacy concerns of performing an evaluation and management service by telephone and the availability of in person appointments. The patient expressed understanding and agreed to proceed.  Interactive audio and video telecommunications were attempted between this nurse and patient, however failed, due to patient having technical difficulties OR patient did not have access to video capability.  We continued and completed visit with audio only.  Some vital signs may be absent or patient reported.   Rasheen Schewe E Nuha Degner, LPN   Review of Systems     Cardiac Risk Factors include: advanced age (>47men, >16 women);dyslipidemia;hypertension;male gender     Objective:    Today's Vitals   11/21/20 1334  Weight: 187 lb (84.8 kg)   Body mass index is 29.29 kg/m.  Advanced Directives 11/21/2020 11/21/2019  Does Patient Have a Medical Advance Directive? Yes Yes  Type of Paramedic of [place];Living will [place];Living will  Does patient want to make changes to medical advance directive? - No - Patient declined  Copy of [place] in Chart? No - copy requested -  Would patient like information on creating a medical advance directive? - No - Patient declined    Current Medications (verified) Outpatient Encounter Medications as of 11/21/2020  Medication Sig   amLODipine (NORVASC) 10 MG tablet Take 1 tablet (10 mg total) by mouth daily.   carvedilol (COREG)  3.125 MG tablet Take 1 tablet (3.125 mg total) by mouth 2 (two) times daily with a meal.   Misc Natural Products (OSTEO BI-FLEX TRIPLE STRENGTH) TABS Take 1 tablet by mouth 2 (two) times daily.   rosuvastatin (CRESTOR) 5 MG tablet Take 1 tablet (5 mg total) by mouth daily. For cholesterol   tamsulosin (FLOMAX) 0.4 MG CAPS capsule Take 2 capsules (0.8 mg total) by mouth daily.   triamcinolone cream (KENALOG) 0.1 % Apply 1 application topically 3 (three) times daily. Avoid face and genitalia   No facility-administered encounter medications on file as of 11/21/2020.    Allergies (verified) Patient has no known allergies.   History: Past Medical History:  Diagnosis Date   Cancer ([place]) 2013   prostate   Hypertension    Macular degeneration    Past Surgical History:  Procedure Laterality Date   PROSTATE SURGERY     Family History  Problem Relation Age of Onset   Cancer Mother    Alcohol abuse Father    Social History   Socioeconomic History   Marital status: Married    Spouse name: Joanne Chars   Number of children: 4   Years of education: Not on file   Highest education level: Not on file  Occupational History   Occupation: retired  Tobacco Use   Smoking status: Never   Smokeless tobacco: Never  Substance and Sexual Activity   Alcohol use: No    Alcohol/week: 0.0 standard drinks   Drug use: No   Sexual activity: Not on file  Other Topics Concern   Not on file  Social History Narrative   Lives at  home with wife. Family lives nearby. Stays active, exercising 3-4 times per week. Hx of prostate cancer - used to see Dr Jalene Mullet.   Social Determinants of Health   Financial Resource Strain: Low Risk    Difficulty of Paying Living Expenses: Not hard at all  Food Insecurity: No Food Insecurity   Worried About Charity fundraiser in the Last Year: Never true   [place] in the Last Year: Never true  Transportation Needs: No Transportation Needs   Lack of Transportation  (Medical): No   Lack of Transportation (Non-Medical): No  Physical Activity: Sufficiently Active   Days of Exercise per Week: 3 days   Minutes of Exercise per Session: 50 min  Stress: No Stress Concern Present   Feeling of Stress : Not at all  Social Connections: Socially Integrated   Frequency of Communication with Friends and Family: More than three times a week   Frequency of Social Gatherings with Friends and Family: Once a week   Attends Religious Services: More than 4 times per year   Active Member of [employer] or Organizations: Yes   Attends Music therapist: More than 4 times per year   Marital Status: Married    Tobacco Counseling Counseling given: Not Answered   Clinical Intake:  Pre-visit preparation completed: Yes  Pain : No/denies pain     BMI - recorded: 29.29 Nutritional Status: BMI 25 -29 Overweight Nutritional Risks: None Diabetes: No  How often do you need to have someone help you when you read instructions, pamphlets, or other written materials from your doctor or pharmacy?: 1 - Never  Diabetic? No  Interpreter Needed?: No  Information entered by :: Catricia Scheerer, LPN   Activities of Daily Living In your present state of health, do you have any difficulty performing the following activities: 11/21/2020  Hearing? N  Vision? N  Difficulty concentrating or making decisions? N  Walking or climbing stairs? N  Dressing or bathing? N  Doing errands, shopping? N  Preparing Food and eating ? N  Using the Toilet? N  In the past six months, have you accidently leaked urine? N  Do you have problems with loss of bowel control? N  Managing your Medications? N  Managing your Finances? N  Housekeeping or managing your Housekeeping? N  Some recent data might be hidden    Patient Care Team: Claretta Fraise, MD as PCP - General (Family Medicine)  Indicate any recent Medical Services you may have received from other than [facility] providers in the past  year (date may be approximate).     Assessment:   This is a routine wellness examination for Rai.  Hearing/Vision screen Hearing Screening - Comments:: Denies hearing difficulties Vision Screening - Comments:: Wears eyeglasses - up to date with routine eye exams - Sees Dr Binnie Rail every 6 months and a specialist in [place].  Dietary issues and exercise activities discussed: Current Exercise Habits: Home exercise routine, Type of exercise: walking;strength training/weights, Time (Minutes): 45, Frequency (Times/Week): 3, Weekly Exercise (Minutes/Week): 135, Intensity: Moderate, Exercise limited by: None identified   Goals Addressed             This Visit's Progress    Reduce sugar intake to X grams per day   On track    Follow wife's lead on portions of desserts, etc.        Depression Screen [facility] 2/9 Scores 11/21/2020 11/19/2020 11/19/2020 07/16/2020 05/21/2020 11/21/2019 11/20/2019  PHQ - 2 Score  0 0 0 0 0 0 0  PHQ- 9 Score 0 1 - - - - -    Fall Risk Fall Risk  11/21/2020 11/19/2020 07/31/2020 07/16/2020 05/21/2020  Falls in the past year? 0 0 0 0 0  Number falls in past yr: 0 - - - -  Injury with Fall? 0 - - - -  Risk for fall due to : Impaired vision - - No Fall Risks -  Follow up Falls prevention discussed - Falls evaluation completed Falls evaluation completed Falls evaluation completed    FALL RISK PREVENTION PERTAINING TO THE HOME:  Any stairs in or around the home? Yes  If so, are there any without handrails? Yes  3-4 steps coming into front door without rails Home free of loose throw rugs in walkways, pet beds, electrical cords, etc? Yes  Adequate lighting in your home to reduce risk of falls? Yes   ASSISTIVE DEVICES UTILIZED TO PREVENT FALLS:  Life alert? No  Use of a cane, walker or w/c? No  Grab bars in the bathroom? No  Shower chair or bench in shower? No  Elevated toilet seat or a handicapped toilet? No   TIMED UP AND GO:  Was the test performed? No .  Telephonic visit.  Cognitive Function:     6CIT Screen 11/21/2020 11/21/2019  What Year? 0 points 0 points  What month? 0 points 0 points  What time? 0 points 0 points  Count back from 20 0 points 0 points  Months in reverse 0 points 0 points  Repeat phrase 0 points 2 points  Total Score 0 2    Immunizations Immunization History  Administered Date(s) Administered   Janssen (J&J) SARS-COV-2 Vaccination 08/27/2019   PFIZER(Purple Top)SARS-COV-2 Vaccination 06/17/2020   Pneumococcal Conjugate-13 09/04/2014, 11/19/2020   Pneumococcal Polysaccharide-23 12/13/2012, 10/05/2017    TDAP status: Up to date  Flu Vaccine status: Declined, Education has been provided regarding the importance of this vaccine but patient still declined. Advised may receive this vaccine at local pharmacy or Health Dept. Aware to provide a copy of the vaccination record if obtained from local pharmacy or Health Dept. Verbalized acceptance and understanding.  Pneumococcal vaccine status: Up to date  Covid-19 vaccine status: Completed vaccines  Qualifies for Shingles Vaccine? Yes   Zostavax completed No   Shingrix Completed?: No.    Education has been provided regarding the importance of this vaccine. Patient has been advised to call insurance company to determine out of pocket expense if they have not yet received this vaccine. Advised may also receive vaccine at local pharmacy or Health Dept. Verbalized acceptance and understanding.  Screening Tests Health Maintenance  Topic Date Due   COVID-19 Vaccine (3 - Booster for Janssen series) 08/12/2020   Zoster Vaccines- Shingrix (1 of 2) 02/19/2021 (Originally 10/02/1954)   Pneumococcal Vaccine 64-88 Years old (1 - PCV) 11/19/2021 (Originally 10/03/1941)   INFLUENZA VACCINE  01/13/2021   TETANUS/TDAP  12/22/2022   PNA vac Low Risk Adult  Completed   HPV VACCINES  Aged Out    Health Maintenance  Health Maintenance Due  Topic Date Due   COVID-19 Vaccine (3 -  Booster for Janssen series) 08/12/2020    Colorectal cancer screening: No longer required.   Lung Cancer Screening: (Low Dose CT Chest recommended if Age 71-80 years, 30 pack-year currently smoking OR have quit w/in 15years.) does not qualify.   Additional Screening:  Hepatitis C Screening: does not qualify  Vision Screening: Recommended annual ophthalmology exams  for early detection of glaucoma and other disorders of the eye. Is the patient up to date with their annual eye exam?  Yes  Who is the provider or what is the name of the office in which the patient attends annual eye exams? [facility] If pt is not established with a provider, would they like to be referred to a provider to establish care? No .   Dental Screening: Recommended annual dental exams for proper oral hygiene  Community Resource Referral / Chronic Care Management: CRR required this visit?  No   CCM required this visit?  No      Plan:     I have personally reviewed and noted the following in the patient's chart:   Medical and social history Use of alcohol, tobacco or illicit drugs  Current medications and supplements including opioid prescriptions. Patient is not currently taking opioid prescriptions. Functional ability and status Nutritional status Physical activity Advanced directives List of other physicians Hospitalizations, surgeries, and ER visits in previous 12 months Vitals Screenings to include cognitive, depression, and falls Referrals and appointments  In addition, I have reviewed and discussed with patient certain preventive protocols, quality metrics, and best practice recommendations. A written personalized care plan for preventive services as well as general preventive health recommendations were provided to patient.     Sandrea Hammond, LPN   08/18/1441   Nurse Notes: None

## 2020-11-21 NOTE — Patient Instructions (Signed)
Mr. Aaron Whitney , Thank you for taking time to come for your Medicare Wellness Visit. I appreciate your ongoing commitment to your health goals. Please review the following plan we discussed and let me know if I can assist you in the future.   Screening recommendations/referrals: Colonoscopy: Done 03/15/2014 - no repeat required Recommended yearly ophthalmology/optometry visit for glaucoma screening and checkup Recommended yearly dental visit for hygiene and checkup  Vaccinations: Influenza vaccine: Declined  Pneumococcal vaccine: Done 10/05/2017 & 11/19/2020 Tdap vaccine: Done 12/21/2012 - Repeat in 10 years Shingles vaccine: Declined   Covid-19: J&J done 08/27/2019 & [place] booster 06/17/2020  Advanced directives: Please bring a copy of your health care power of attorney and living will to the office to be added to your chart at your convenience.  Conditions/risks identified: Aim for 30 minutes of exercise or brisk walking each day, drink 6-8 glasses of water and eat lots of fruits and vegetables.  Next appointment: Follow up in one year for your annual wellness visit.   Preventive Care 75 Years and Older, Male  Preventive care refers to lifestyle choices and visits with your health care provider that can promote health and wellness. What does preventive care include? A yearly physical exam. This is also called an annual well check. Dental exams once or twice a year. Routine eye exams. Ask your health care provider how often you should have your eyes checked. Personal lifestyle choices, including: Daily care of your teeth and gums. Regular physical activity. Eating a healthy diet. Avoiding tobacco and drug use. Limiting alcohol use. Practicing safe sex. Taking low doses of aspirin every day. Taking vitamin and mineral supplements as recommended by your health care provider. What happens during an annual well check? The services and screenings done by your health care provider during  your annual well check will depend on your age, overall health, lifestyle risk factors, and family history of disease. Counseling  Your health care provider may ask you questions about your: Alcohol use. Tobacco use. Drug use. Emotional well-being. Home and relationship well-being. Sexual activity. Eating habits. History of falls. Memory and ability to understand (cognition). Work and work Statistician. Screening  You may have the following tests or measurements: Height, weight, and BMI. Blood pressure. Lipid and cholesterol levels. These may be checked every 5 years, or more frequently if you are over 83 years old. Skin check. Lung cancer screening. You may have this screening every year starting at age 86 if you have a 30-pack-year history of smoking and currently smoke or have quit within the past 15 years. Fecal occult blood test (FOBT) of the stool. You may have this test every year starting at age 83. Flexible sigmoidoscopy or colonoscopy. You may have a sigmoidoscopy every 5 years or a colonoscopy every 10 years starting at age 83. Prostate cancer screening. Recommendations will vary depending on your family history and other risks. Hepatitis C blood test. Hepatitis B blood test. Sexually transmitted disease (STD) testing. Diabetes screening. This is done by checking your blood sugar (glucose) after you have not eaten for a while (fasting). You may have this done every 1-3 years. Abdominal aortic aneurysm (AAA) screening. You may need this if you are a current or former smoker. Osteoporosis. You may be screened starting at age 89 if you are at high risk. Talk with your health care provider about your test results, treatment options, and if necessary, the need for more tests. Vaccines  Your health care provider may recommend certain vaccines, such  as: Influenza vaccine. This is recommended every year. Tetanus, diphtheria, and acellular pertussis (Tdap, Td) vaccine. You may need  a Td booster every 10 years. Zoster vaccine. You may need this after age 28. Pneumococcal 13-valent conjugate (PCV13) vaccine. One dose is recommended after age 53. Pneumococcal polysaccharide (PPSV23) vaccine. One dose is recommended after age 55. Talk to your health care provider about which screenings and vaccines you need and how often you need them. This information is not intended to replace advice given to you by your health care provider. Make sure you discuss any questions you have with your health care provider. Document Released: 06/28/2015 Document Revised: 02/19/2016 Document Reviewed: 04/02/2015 Elsevier Interactive Patient Education  2017 [place] Prevention in the Home Falls can cause injuries. They can happen to people of all ages. There are many things you can do to make your home safe and to help prevent falls. What can I do on the outside of my home? Regularly fix the edges of walkways and driveways and fix any cracks. Remove anything that might make you trip as you walk through a door, such as a raised step or threshold. Trim any bushes or trees on the path to your home. Use bright outdoor lighting. Clear any walking paths of anything that might make someone trip, such as rocks or tools. Regularly check to see if handrails are loose or broken. Make sure that both sides of any steps have handrails. Any raised decks and porches should have guardrails on the edges. Have any leaves, snow, or ice cleared regularly. Use sand or salt on walking paths during winter. Clean up any spills in your garage right away. This includes oil or grease spills. What can I do in the bathroom? Use night lights. Install grab bars by the toilet and in the tub and shower. Do not use towel bars as grab bars. Use non-skid mats or decals in the tub or shower. If you need to sit down in the shower, use a plastic, non-slip stool. Keep the floor dry. Clean up any water that spills on the  floor as soon as it happens. Remove soap buildup in the tub or shower regularly. Attach bath mats securely with double-sided non-slip rug tape. Do not have throw rugs and other things on the floor that can make you trip. What can I do in the bedroom? Use night lights. Make sure that you have a light by your bed that is easy to reach. Do not use any sheets or blankets that are too big for your bed. They should not hang down onto the floor. Have a firm chair that has side arms. You can use this for support while you get dressed. Do not have throw rugs and other things on the floor that can make you trip. What can I do in the kitchen? Clean up any spills right away. Avoid walking on wet floors. Keep items that you use a lot in easy-to-reach places. If you need to reach something above you, use a strong step stool that has a grab bar. Keep electrical cords out of the way. Do not use floor polish or wax that makes floors slippery. If you must use wax, use non-skid floor wax. Do not have throw rugs and other things on the floor that can make you trip. What can I do with my stairs? Do not leave any items on the stairs. Make sure that there are handrails on both sides of the stairs and use  them. Fix handrails that are broken or loose. Make sure that handrails are as long as the stairways. Check any carpeting to make sure that it is firmly attached to the stairs. Fix any carpet that is loose or worn. Avoid having throw rugs at the top or bottom of the stairs. If you do have throw rugs, attach them to the floor with carpet tape. Make sure that you have a light switch at the top of the stairs and the bottom of the stairs. If you do not have them, ask someone to add them for you. What else can I do to help prevent falls? Wear shoes that: Do not have high heels. Have rubber bottoms. Are comfortable and fit you well. Are closed at the toe. Do not wear sandals. If you use a stepladder: Make sure that  it is fully opened. Do not climb a closed stepladder. Make sure that both sides of the stepladder are locked into place. Ask someone to hold it for you, if possible. Clearly mark and make sure that you can see: Any grab bars or handrails. First and last steps. Where the edge of each step is. Use tools that help you move around (mobility aids) if they are needed. These include: Canes. Walkers. Scooters. Crutches. Turn on the lights when you go into a dark area. Replace any light bulbs as soon as they burn out. Set up your furniture so you have a clear path. Avoid moving your furniture around. If any of your floors are uneven, fix them. If there are any pets around you, be aware of where they are. Review your medicines with your doctor. Some medicines can make you feel dizzy. This can increase your chance of falling. Ask your doctor what other things that you can do to help prevent falls. This information is not intended to replace advice given to you by your health care provider. Make sure you discuss any questions you have with your health care provider. Document Released: 03/28/2009 Document Revised: 11/07/2015 Document Reviewed: 07/06/2014 Elsevier Interactive Patient Education  2017 [employer].

## 2020-11-25 ENCOUNTER — Other Ambulatory Visit: Payer: Self-pay | Admitting: Family Medicine

## 2021-05-21 ENCOUNTER — Other Ambulatory Visit: Payer: Self-pay | Admitting: Family Medicine

## 2021-05-21 ENCOUNTER — Other Ambulatory Visit: Payer: Self-pay

## 2021-05-21 ENCOUNTER — Encounter: Payer: Self-pay | Admitting: Family Medicine

## 2021-05-21 ENCOUNTER — Ambulatory Visit (INDEPENDENT_AMBULATORY_CARE_PROVIDER_SITE_OTHER): Payer: Medicare Other | Admitting: Family Medicine

## 2021-05-21 VITALS — BP 139/73 | HR 72 | Temp 98.1°F | Resp 20 | Ht 67.0 in | Wt 183.0 lb

## 2021-05-21 DIAGNOSIS — M4716 Other spondylosis with myelopathy, lumbar region: Secondary | ICD-10-CM

## 2021-05-21 DIAGNOSIS — C61 Malignant neoplasm of prostate: Secondary | ICD-10-CM

## 2021-05-21 DIAGNOSIS — I10 Essential (primary) hypertension: Secondary | ICD-10-CM

## 2021-05-21 DIAGNOSIS — N401 Enlarged prostate with lower urinary tract symptoms: Secondary | ICD-10-CM | POA: Diagnosis not present

## 2021-05-21 DIAGNOSIS — R351 Nocturia: Secondary | ICD-10-CM

## 2021-05-21 NOTE — Progress Notes (Signed)
Subjective:  Patient ID: Aaron Whitney, male    DOB: Jul 15, 1935  Age: 85 y.o. MRN: [id number]  CC: Medical Management of Chronic Issues (6 mo )   HPI [name] presents for  follow-up of hypertension. Patient has no history of headache chest pain or shortness of breath or recent cough. Patient also denies symptoms of TIA such as focal numbness or weakness. Patient denies side effects from medication. States taking it regularly. BP checked at home twice a day. Usually 120/70s.   Nocturia X 4 per night. Collected it. Was 50 oz. Last night.  Patient has a history of prostate cancer.  He he sees Dr. Vicie Mutters in [place].  He has not seen him in some time.  He was told in the past that he was a candidate for having a metal tube inserted into his prostate to open it up but he declined the procedure at the time.   History [name] has a past medical history of Cancer ([place]) (2013), Hypertension, and Macular degeneration.   He has a past surgical history that includes Prostate surgery.   His family history includes Alcohol abuse in his father; Cancer in his mother.He reports that he has never smoked. He has never used smokeless tobacco. He reports that he does not drink alcohol and does not use drugs.  Current Outpatient Medications on File Prior to Visit  Medication Sig Dispense Refill   amLODipine (NORVASC) 10 MG tablet TAKE ONE TABLET BY MOUTH ONE TIME DAILY 90 tablet 1   carvedilol (COREG) 3.125 MG tablet Take 1 tablet (3.125 mg total) by mouth 2 (two) times daily with a meal. 180 tablet 3   Misc Natural Products (OSTEO BI-FLEX TRIPLE STRENGTH) TABS Take 1 tablet by mouth 2 (two) times daily. 1 tablet 1   tamsulosin (FLOMAX) 0.4 MG CAPS capsule Take 2 capsules (0.8 mg total) by mouth daily. 180 capsule 1   No current facility-administered medications on file prior to visit.    ROS Review of Systems  Objective:  BP 139/73   Pulse 72   Temp 98.1 F (36.7 C)    Resp 20   Ht '5\' 7"'  (1.702 m)   Wt 183 lb (83 kg)   SpO2 96%   BMI 28.66 kg/m   BP Readings from Last 3 Encounters:  05/21/21 139/73  11/19/20 129/74  07/31/20 134/83    Wt Readings from Last 3 Encounters:  05/21/21 183 lb (83 kg)  11/21/20 187 lb (84.8 kg)  11/19/20 187 lb 3.2 oz (84.9 kg)     Physical Exam    Assessment & Plan:   Myson was seen today for medical management of chronic issues.  Diagnoses and all orders for this visit:  Essential hypertension -     CBC with Differential/Platelet -     CMP14+EGFR -     Lipid panel  CA of prostate (HCC) -     PSA, total and free  Lumbar spondylosis with myelopathy -     CBC with Differential/Platelet -     CMP14+EGFR -     Lipid panel  Benign prostatic hyperplasia with nocturia  Allergies as of 05/21/2021   No Known Allergies      Medication List        Accurate as of May 21, 2021  8:35 PM. If you have any questions, ask your nurse or doctor.          STOP taking these medications    rosuvastatin 5 MG  tablet Commonly known as: Crestor Stopped by: Claretta Fraise, MD   triamcinolone cream 0.1 % Commonly known as: KENALOG Stopped by: Claretta Fraise, MD       TAKE these medications    amLODipine 10 MG tablet Commonly known as: NORVASC TAKE ONE TABLET BY MOUTH ONE TIME DAILY   carvedilol 3.125 MG tablet Commonly known as: COREG Take 1 tablet (3.125 mg total) by mouth 2 (two) times daily with a meal.   Osteo Bi-Flex Triple Strength Tabs Take 1 tablet by mouth 2 (two) times daily.   tamsulosin 0.4 MG Caps capsule Commonly known as: FLOMAX Take 2 capsules (0.8 mg total) by mouth daily.             Follow-up: Return in about 6 months (around 11/19/2021).  Claretta Fraise, M.D.

## 2021-05-22 LAB — CBC WITH DIFFERENTIAL/PLATELET
Basophils Absolute: 0.1 10*3/uL (ref 0.0–0.2)
Basos: 1 %
EOS (ABSOLUTE): 0.3 10*3/uL (ref 0.0–0.4)
Eos: 4 %
Hematocrit: 45.8 % (ref 37.5–51.0)
Hemoglobin: 15.5 g/dL (ref 13.0–17.7)
Immature Grans (Abs): 0 10*3/uL (ref 0.0–0.1)
Immature Granulocytes: 1 %
Lymphocytes Absolute: 2 10*3/uL (ref 0.7–3.1)
Lymphs: 25 %
MCH: 29.8 pg (ref 26.6–33.0)
MCHC: 33.8 g/dL (ref 31.5–35.7)
MCV: 88 fL (ref 79–97)
Monocytes Absolute: 0.8 10*3/uL (ref 0.1–0.9)
Monocytes: 10 %
Neutrophils Absolute: 5 10*3/uL (ref 1.4–7.0)
Neutrophils: 59 %
Platelets: 217 10*3/uL (ref 150–450)
RBC: 5.2 x10E6/uL (ref 4.14–5.80)
RDW: 13.2 % (ref 11.6–15.4)
WBC: 8.2 10*3/uL (ref 3.4–10.8)

## 2021-05-22 LAB — CMP14+EGFR
ALT: 22 IU/L (ref 0–44)
AST: 22 IU/L (ref 0–40)
Albumin/Globulin Ratio: 1.8 (ref 1.2–2.2)
Albumin: 4.5 g/dL (ref 3.6–4.6)
Alkaline Phosphatase: 65 IU/L (ref 44–121)
BUN/Creatinine Ratio: 16 (ref 10–24)
BUN: 14 mg/dL (ref 8–27)
Bilirubin Total: 0.6 mg/dL (ref 0.0–1.2)
CO2: 27 mmol/L (ref 20–29)
Calcium: 9.7 mg/dL (ref 8.6–10.2)
Chloride: 99 mmol/L (ref 96–106)
Creatinine, Ser: 0.89 mg/dL (ref 0.76–1.27)
Globulin, Total: 2.5 g/dL (ref 1.5–4.5)
Glucose: 119 mg/dL — ABNORMAL HIGH (ref 70–99)
Potassium: 4.9 mmol/L (ref 3.5–5.2)
Sodium: 138 mmol/L (ref 134–144)
Total Protein: 7 g/dL (ref 6.0–8.5)
eGFR: 85 mL/min/{1.73_m2} (ref >59)

## 2021-05-22 LAB — PSA, TOTAL AND FREE
PSA, Free Pct: 6.9 %
PSA, Free: 0.11 ng/mL
Prostate Specific Ag, Serum: 1.6 ng/mL (ref 0.0–4.0)

## 2021-05-22 LAB — LIPID PANEL
Chol/HDL Ratio: 5.7 ratio — ABNORMAL HIGH (ref 0.0–5.0)
Cholesterol, Total: 229 mg/dL — ABNORMAL HIGH (ref 100–199)
HDL: 40 mg/dL (ref >39)
LDL Chol Calc (NIH): 174 mg/dL — ABNORMAL HIGH (ref 0–99)
Triglycerides: 87 mg/dL (ref 0–149)
VLDL Cholesterol Cal: 15 mg/dL (ref 5–40)

## 2021-05-26 ENCOUNTER — Other Ambulatory Visit: Payer: Self-pay | Admitting: Family Medicine

## 2021-05-26 MED ORDER — EZETIMIBE 10 MG PO TABS
10.0000 mg | ORAL_TABLET | Freq: Every day | ORAL | 3 refills | Status: DC
Start: 2021-05-26 — End: 2021-09-09

## 2021-09-03 ENCOUNTER — Encounter: Payer: Self-pay | Admitting: Family Medicine

## 2021-09-03 ENCOUNTER — Ambulatory Visit (INDEPENDENT_AMBULATORY_CARE_PROVIDER_SITE_OTHER): Payer: Medicare Other | Admitting: Family Medicine

## 2021-09-03 VITALS — BP 138/74 | HR 63 | Temp 97.2°F | Ht 67.0 in | Wt 185.6 lb

## 2021-09-03 DIAGNOSIS — I499 Cardiac arrhythmia, unspecified: Secondary | ICD-10-CM | POA: Diagnosis not present

## 2021-09-03 DIAGNOSIS — I493 Ventricular premature depolarization: Secondary | ICD-10-CM

## 2021-09-03 NOTE — Progress Notes (Signed)
? ?Assessment & Plan:  ?1. PVCs (premature ventricular contractions) ?Education provided on PVCs. Labs to assess for underlying cause. ?- CBC with Differential/Platelet ?- CMP14+EGFR ?- TSH ? ?2. Irregular heart beats ?- EKG 12-Lead ? ? ?Follow up plan: Return if symptoms worsen or fail to improve. ? ?Hendricks Limes, MSN, APRN, FNP-C ?[place] ? ?Subjective:  ? ?Patient ID: Aaron Whitney, male    DOB: [birth year], 86 y.o.   MRN: [id number] ? ?HPI: ?Aaron Whitney is a 86 y.o. male presenting on 09/03/2021 for Heart skipping beats? (X 2 days) ? ?Patient is accompanied by his wife, who he is okay with being present. ? ?Patient reports he can feel his heart skipping beats, like a fluttering. This has been going on for two days. He reports normally his heart rate is in the 70s and it has been in the 40-50s today. Denies chest pain, shortness of breath, dizziness, and feeling faint.  ? ? ?ROS: Negative unless specifically indicated above in HPI.  ? ?Relevant past medical history reviewed and updated as indicated.  ? ?Allergies and medications reviewed and updated. ? ? ?Current Outpatient Medications:  ?  amLODipine (NORVASC) 10 MG tablet, TAKE ONE TABLET BY MOUTH ONE TIME DAILY, Disp: 90 tablet, Rfl: 1 ?  carvedilol (COREG) 3.125 MG tablet, Take 1 tablet (3.125 mg total) by mouth 2 (two) times daily with a meal., Disp: 180 tablet, Rfl: 3 ?  Misc Natural Products (OSTEO BI-FLEX TRIPLE STRENGTH) TABS, Take 1 tablet by mouth 2 (two) times daily., Disp: 1 tablet, Rfl: 1 ?  tamsulosin (FLOMAX) 0.4 MG CAPS capsule, Take 2 capsules (0.8 mg total) by mouth daily., Disp: 180 capsule, Rfl: 1 ?  ezetimibe (ZETIA) 10 MG tablet, Take 1 tablet (10 mg total) by mouth daily. For cholesterol (Patient not taking: Reported on 09/03/2021), Disp: 90 tablet, Rfl: 3 ? ?No Known Allergies ? ?Objective:  ? ?BP 138/74 Comment: at home before apt  Pulse 63   Temp (!) 97.2 ?F (36.2 ?C) (Temporal)   Ht _0   (1.702 m)   Wt 185 lb 9.6 oz (84.2 kg)   SpO2 97%   BMI 29.07 kg/m?   ? ?Physical Exam ?Vitals reviewed.  ?Constitutional:   ?   General: He is not in acute distress. ?   Appearance: Normal appearance. He is not ill-appearing, toxic-appearing or diaphoretic.  ?HENT:  ?   Head: Normocephalic and atraumatic.  ?Eyes:  ?   General: No scleral icterus.    ?   Right eye: No discharge.     ?   Left eye: No discharge.  ?   Conjunctiva/sclera: Conjunctivae normal.  ?Cardiovascular:  ?   Rate and Rhythm: Normal rate. Rhythm irregularly irregular.  ?   Heart sounds: Normal heart sounds. No murmur heard. ?  No friction rub. No gallop.  ?Pulmonary:  ?   Effort: Pulmonary effort is normal. No respiratory distress.  ?   Breath sounds: Normal breath sounds. No stridor. No wheezing, rhonchi or rales.  ?Musculoskeletal:     ?   General: Normal range of motion.  ?   Cervical back: Normal range of motion.  ?Skin: ?   General: Skin is warm and dry.  ?Neurological:  ?   Mental Status: He is alert and oriented to person, place, and time. Mental status is at baseline.  ?Psychiatric:     ?   Mood and Affect: Mood normal.     ?   Behavior: Behavior  normal.     ?   Thought Content: Thought content normal.     ?   Judgment: Judgment normal.  ? ?EKG: new PVCs with unchanged right bundle branch block ? ? ? ?

## 2021-09-04 LAB — CBC WITH DIFFERENTIAL/PLATELET
Basophils Absolute: 0.1 10*3/uL (ref 0.0–0.2)
Basos: 1 %
EOS (ABSOLUTE): 0.3 10*3/uL (ref 0.0–0.4)
Eos: 3 %
Hematocrit: 44.9 % (ref 37.5–51.0)
Hemoglobin: 16 g/dL (ref 13.0–17.7)
Immature Grans (Abs): 0 10*3/uL (ref 0.0–0.1)
Immature Granulocytes: 0 %
Lymphocytes Absolute: 2.5 10*3/uL (ref 0.7–3.1)
Lymphs: 28 %
MCH: 31.7 pg (ref 26.6–33.0)
MCHC: 35.6 g/dL (ref 31.5–35.7)
MCV: 89 fL (ref 79–97)
Monocytes Absolute: 0.8 10*3/uL (ref 0.1–0.9)
Monocytes: 9 %
Neutrophils Absolute: 5.5 10*3/uL (ref 1.4–7.0)
Neutrophils: 59 %
Platelets: 201 10*3/uL (ref 150–450)
RBC: 5.04 x10E6/uL (ref 4.14–5.80)
RDW: 13.4 % (ref 11.6–15.4)
WBC: 9.2 10*3/uL (ref 3.4–10.8)

## 2021-09-04 LAB — CMP14+EGFR
ALT: 22 IU/L (ref 0–44)
AST: 23 IU/L (ref 0–40)
Albumin/Globulin Ratio: 1.6 (ref 1.2–2.2)
Albumin: 4.4 g/dL (ref 3.6–4.6)
Alkaline Phosphatase: 60 IU/L (ref 44–121)
BUN/Creatinine Ratio: 15 (ref 10–24)
BUN: 15 mg/dL (ref 8–27)
Bilirubin Total: 0.6 mg/dL (ref 0.0–1.2)
CO2: 23 mmol/L (ref 20–29)
Calcium: 9.6 mg/dL (ref 8.6–10.2)
Chloride: 98 mmol/L (ref 96–106)
Creatinine, Ser: 0.98 mg/dL (ref 0.76–1.27)
Globulin, Total: 2.8 g/dL (ref 1.5–4.5)
Glucose: 107 mg/dL — ABNORMAL HIGH (ref 70–99)
Potassium: 4.4 mmol/L (ref 3.5–5.2)
Sodium: 138 mmol/L (ref 134–144)
Total Protein: 7.2 g/dL (ref 6.0–8.5)
eGFR: 77 mL/min/{1.73_m2} (ref >59)

## 2021-09-04 LAB — TSH: TSH: 2.83 u[IU]/mL (ref 0.450–4.500)

## 2021-09-08 ENCOUNTER — Ambulatory Visit: Payer: Medicare Other | Admitting: Family Medicine

## 2021-09-08 ENCOUNTER — Encounter: Payer: Self-pay | Admitting: Family Medicine

## 2021-09-08 NOTE — Telephone Encounter (Signed)
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

## 2021-09-09 ENCOUNTER — Ambulatory Visit (INDEPENDENT_AMBULATORY_CARE_PROVIDER_SITE_OTHER): Payer: Medicare Other | Admitting: Family Medicine

## 2021-09-09 ENCOUNTER — Encounter: Payer: Self-pay | Admitting: Family Medicine

## 2021-09-09 ENCOUNTER — Inpatient Hospital Stay: Payer: Medicare Other

## 2021-09-09 VITALS — BP 130/77 | HR 76 | Temp 97.9°F | Ht 67.0 in | Wt 182.4 lb

## 2021-09-09 DIAGNOSIS — I499 Cardiac arrhythmia, unspecified: Secondary | ICD-10-CM

## 2021-09-09 DIAGNOSIS — R002 Palpitations: Secondary | ICD-10-CM | POA: Diagnosis not present

## 2021-09-09 DIAGNOSIS — I4510 Unspecified right bundle-branch block: Secondary | ICD-10-CM

## 2021-09-09 DIAGNOSIS — I493 Ventricular premature depolarization: Secondary | ICD-10-CM | POA: Diagnosis not present

## 2021-09-09 NOTE — Progress Notes (Signed)
Palpitaions ongoing ofr about ? ?Subjective:  ?Patient ID: Aaron OSHITA, male    DOB: 07/31/1935  Age: 86 y.o. MRN: [id number] ? ?CC: Irregular Heart Beat ? ? ?HPI ?Aaron Whitney presents for palpitations onset about 9 days ago. He was seen then and EKG showed RBBB. Multifocal PVCs. Since then he has had multiple episodes but no chest pain, dyspnea or dizziness. He can feel the irregularity in the carotid pulse. His pulse rate dropped to about 50 at onset, but since then it has remained in the 70s.  ? ? ?  05/21/2021  ?  9:51 AM 11/21/2020  ?  1:57 PM 11/19/2020  ?  9:18 AM  ?Depression screen PHQ 2/9  ?Decreased Interest 0 0 0  ?Down, Depressed, Hopeless 0 0 0  ?PHQ - 2 Score 0 0 0  ?Altered sleeping  0 0  ?Tired, decreased energy  0 1  ?Change in appetite  0 0  ?Feeling bad or failure about yourself   0 0  ?Trouble concentrating  0 0  ?Moving slowly or fidgety/restless  0 0  ?Suicidal thoughts  0 0  ?PHQ-9 Score  0 1  ?Difficult doing work/chores  Not difficult at all Not difficult at all  ? ? ?History ?Aaron Whitney has Whitney past medical history of Cancer ([place]) (2013), Hypertension, and Macular degeneration.  ? ?He has Whitney past surgical history that includes Prostate surgery.  ? ?His family history includes Alcohol abuse in his father; Cancer in his mother.He reports that he has never smoked. He has never used smokeless tobacco. He reports that he does not drink alcohol and does not use drugs. ? ? ? ?ROS ?Review of Systems  ?Constitutional:  Negative for fever.  ?Respiratory:  Negative for shortness of breath.   ?Cardiovascular:  Positive for palpitations. Negative for chest pain.  ?Musculoskeletal:  Negative for arthralgias.  ?Skin:  Negative for rash.  ? ?Objective:  ?BP 130/77   Pulse 76   Temp 97.9 ?F (36.6 ?C)   Ht '5\' 7"'$  (1.702 m)   Wt 182 lb 6.4 oz (82.7 kg)   SpO2 95%   BMI 28.57 kg/m?  ? ?BP Readings from Last 3 Encounters:  ?09/09/21 130/77  ?09/03/21 138/74  ?05/21/21 139/73  ? ? ?Wt Readings  from Last 3 Encounters:  ?09/09/21 182 lb 6.4 oz (82.7 kg)  ?09/03/21 185 lb 9.6 oz (84.2 kg)  ?05/21/21 183 lb (83 kg)  ? ? ? ?Physical Exam ?Vitals reviewed.  ?Constitutional:   ?   Appearance: He is well-developed.  ?HENT:  ?   Head: Normocephalic and atraumatic.  ?   Right Ear: External ear normal.  ?   Left Ear: External ear normal.  ?   Mouth/Throat:  ?   Pharynx: No oropharyngeal exudate or posterior oropharyngeal erythema.  ?Eyes:  ?   Pupils: Pupils are equal, round, and reactive to light.  ?Cardiovascular:  ?   Rate and Rhythm: Normal rate and regular rhythm.  ?   Heart sounds: No murmur heard. ?Pulmonary:  ?   Effort: No respiratory distress.  ?   Breath sounds: Normal breath sounds.  ?Musculoskeletal:  ?   Cervical back: Normal range of motion and neck supple.  ?Neurological:  ?   Mental Status: He is alert and oriented to person, place, and time.  ? ?EKG shows PVCs and RBBB with Whitney NSR. No ischemic changes ? ?Assessment & Plan:  ? ?Aaron Whitney was seen today for irregular heart beat. ? ?Diagnoses  and all orders for this visit: ? ?Irregular heartbeat ?-     EKG 12-Lead ?-     Ambulatory referral to Cardiology ?-     LONG TERM MONITOR (3-14 DAYS); Future ? ?Right bundle branch block ?-     LONG TERM MONITOR (3-14 DAYS); Future ? ?PVCs (premature ventricular contractions) ?-     LONG TERM MONITOR (3-14 DAYS); Future ? ?Palpitations ?-     LONG TERM MONITOR (3-14 DAYS); Future ? ? ? ? ? ? ?I have discontinued [name]. Aaron "[name]"'s ezetimibe. I am also having him maintain his Osteo Bi-Flex Triple Strength, carvedilol, tamsulosin, and amLODipine. ? ?Allergies as of 09/09/2021   ?No Known Allergies ?  ? ?  ?Medication List  ?  ? ?  ? Accurate as of September 09, 2021  7:34 PM. If you have any questions, ask your nurse or doctor.  ?  ?  ? ?  ? ?STOP taking these medications   ? ?ezetimibe 10 MG tablet ?Commonly known as: Zetia ?Stopped by: Claretta Fraise, MD ?  ? ?  ? ?TAKE these medications   ? ?amLODipine 10  MG tablet ?Commonly known as: NORVASC ?TAKE ONE TABLET BY MOUTH ONE TIME DAILY ?  ?carvedilol 3.125 MG tablet ?Commonly known as: COREG ?Take 1 tablet (3.125 mg total) by mouth 2 (two) times daily with Whitney meal. ?  ?Osteo Bi-Flex Triple Strength Tabs ?Take 1 tablet by mouth 2 (two) times daily. ?  ?tamsulosin 0.4 MG Caps capsule ?Commonly known as: FLOMAX ?Take 2 capsules (0.8 mg total) by mouth daily. ?  ? ?  ? ? ? ?Follow-up: No follow-ups on file. ? ?Claretta Fraise, M.D. ?

## 2021-09-16 ENCOUNTER — Encounter: Payer: Self-pay | Admitting: Cardiology

## 2021-09-16 ENCOUNTER — Ambulatory Visit: Payer: Medicare Other | Admitting: Cardiology

## 2021-09-16 VITALS — BP 110/68 | HR 90 | Ht 67.0 in | Wt 182.0 lb

## 2021-09-16 DIAGNOSIS — E782 Mixed hyperlipidemia: Secondary | ICD-10-CM | POA: Diagnosis not present

## 2021-09-16 DIAGNOSIS — R9431 Abnormal electrocardiogram [ECG] [EKG]: Secondary | ICD-10-CM

## 2021-09-16 DIAGNOSIS — I4510 Unspecified right bundle-branch block: Secondary | ICD-10-CM | POA: Diagnosis not present

## 2021-09-16 DIAGNOSIS — R002 Palpitations: Secondary | ICD-10-CM | POA: Diagnosis not present

## 2021-09-16 NOTE — Assessment & Plan Note (Signed)
He is taking fish oil over-the-counter.  His LDL was quite elevated at 174 at last check.  Diet, exercise. ?

## 2021-09-16 NOTE — Progress Notes (Signed)
?Cardiology Office Note:   ? ?Date:  09/16/2021  ? ?ID:  Aaron Whitney, DOB February 25, 1936, MRN [id number] ? ?PCP:  Aaron Fraise, MD ?  ?[facility] Providers ?Cardiologist:  Aaron Furbish, MD    ? ?Referring MD: Aaron Fraise, MD  ? ?History of Present Illness:   ? ?Aaron Whitney is a 86 y.o. male here for the evaluation of irregular heartbeat at the request of Dr. Claretta Whitney. ? ?He saw Dr. Livia Whitney on 09/09/2021 and reported palpitations with onset 9 days prior. His EKG showed a RBBB and multifocal PVCs. He also felt the irregularity in his carotid pulse, which had dropped to 50 at onset but increased to the 70s since then. Ezetimibe was discontinued, and he was referred to cardiology.  ? ?An event monitor was started 09/09/2021 but has not yet been finalized. ? ?Today: ?Overall, he is feeling good. ? ?He reports that about 2 weeks ago, his heart rate was very low in the 30's-40's per his BP machine after taking his blood pressure at home. Normally his heart rate is in the 70s. He can feel the rush and skipped heart beats when he checks his carotid pulse in his neck.   ? ?Currently he is wearing a heart monitor. The patient say he feels a flutter in his chest occasionally. ? ?The patient has "a lot" of arthritis and experiences joint pain.  ? ?Lately, his diet has been stable with no major changes. However, he does note consuming too much sugar. He drinks 1 cup of coffee in the mornings for caffeine and takes OTC supplements including multivitamins, magnesium, fish oil, and fiber. ? ?He reports that his father and mother never experienced heart problems. He never smoked or drank alcohol.  ? ?He denies any chest pain, shortness of breath, or peripheral edema. No lightheadedness, headaches, syncope, orthopnea, or PND. ? 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 ? ?Prior office notes reviewed.  Lab work reviewed.  Prior EKG personally reviewed and interpreted  showing PVCs and right bundle branch block.  I will personally review and interpret his ZIO monitor as well. ? ?Past Medical History:  ?Diagnosis Date  ? Cancer [facility]) 2013  ? prostate  ? Hypertension   ? Macular degeneration   ? ? ?Past Surgical History:  ?Procedure Laterality Date  ? PROSTATE SURGERY    ? ? ?Current Medications: ?Current Meds  ?Medication Sig  ? amLODipine (NORVASC) 10 MG tablet TAKE ONE TABLET BY MOUTH ONE TIME DAILY  ? carvedilol (COREG) 3.125 MG tablet Take 1 tablet (3.125 mg total) by mouth 2 (two) times daily with a meal.  ? Misc Natural Products (OSTEO BI-FLEX TRIPLE STRENGTH) TABS Take 1 tablet by mouth 2 (two) times daily.  ? tamsulosin (FLOMAX) 0.4 MG CAPS capsule Take 2 capsules (0.8 mg total) by mouth daily.  ?  ? ?Allergies:   Patient has no known allergies.  ? ?Social History  ? ?Socioeconomic History  ? Marital status: Married  ?  Spouse name: Aaron Whitney  ? Number of children: 4  ? Years of education: Not on file  ? Highest education level: Not on file  ?Occupational History  ? Occupation: retired  ?Tobacco Use  ? Smoking status: Never  ? Smokeless tobacco: Never  ?Substance and Sexual Activity  ? Alcohol use: No  ?  Alcohol/week: 0.0 standard drinks  ? Drug use: No  ? Sexual activity: Not on file  ?Other Topics  Concern  ? Not on file  ?Social History Narrative  ? Lives at home with wife. Family lives nearby. Stays active, exercising 3-4 times per week. Hx of prostate cancer - used to see Dr Aaron Whitney.  ? ?Social Determinants of Health  ? ?Financial Resource Strain: Low Risk   ? Difficulty of Paying Living Expenses: Not hard at all  ?Food Insecurity: No Food Insecurity  ? Worried About Charity fundraiser in the Last Year: Never true  ? Ran Out of Food in the Last Year: Never true  ?Transportation Needs: No Transportation Needs  ? Lack of Transportation (Medical): No  ? Lack of Transportation (Non-Medical): No  ?Physical Activity: Sufficiently Active  ? Days of Exercise per Week: 3 days  ?  Minutes of Exercise per Session: 50 min  ?Stress: No Stress Concern Present  ? Feeling of Stress : Not at all  ?Social Connections: Socially Integrated  ? Frequency of Communication with Friends and Family: More than three times a week  ? Frequency of Social Gatherings with Friends and Family: Once a week  ? Attends Religious Services: More than 4 times per year  ? Active Member of Clubs or Organizations: Yes  ? Attends Archivist Meetings: More than 4 times per year  ? Marital Status: Married  ?  ? ?Family History: ?The patient's family history includes Alcohol abuse in his father; Cancer in his mother. ? ?ROS:   ?Please see the history of present illness.    ?(+) Palpitations ?(+) Joint pain ?All other systems reviewed and are negative. ? ?EKGs/Labs/Other Studies Reviewed:   ? ?The following studies were reviewed today: ? ?No prior cardiovascular studies available. ? ? ?EKG:  EKG is personally reviewed and interpreted. ?09/16/2021: EKG was not ordered. ?09/09/2021: (Dr. Livia Whitney) PVCs and RBBB with a NSR. No ischemic changes ? ?Recent Labs: ?09/03/2021: ALT 22; BUN 15; Creatinine, Ser 0.98; Hemoglobin 16.0; Platelets 201; Potassium 4.4; Sodium 138; TSH 2.830  ? ?Recent Lipid Panel ?   ?Component Value Date/Time  ? CHOL 229 (H) 05/21/2021 1040  ? TRIG 87 05/21/2021 1040  ? TRIG 76 09/04/2014 1005  ? HDL 40 05/21/2021 1040  ? HDL 49 09/04/2014 1005  ? CHOLHDL 5.7 (H) 05/21/2021 1040  ? LDLCALC 174 (H) 05/21/2021 1040  ? ? ? ?Risk Assessment/Calculations:   ? ? ?    ? ?Physical Exam:   ? ?VS:  BP 110/68   Pulse 90   Ht '5\' 7"'$  (1.702 m)   Wt 182 lb (82.6 kg)   SpO2 93%   BMI 28.51 kg/m?    ? ?Wt Readings from Last 3 Encounters:  ?09/16/21 182 lb (82.6 kg)  ?09/09/21 182 lb 6.4 oz (82.7 kg)  ?09/03/21 185 lb 9.6 oz (84.2 kg)  ?  ? ?GEN: Well nourished, well developed in no acute distress ?HEENT: Normal ?NECK: No JVD; No carotid bruits ?LYMPHATICS: No lymphadenopathy ?CARDIAC: RRR with occasional skipped beat,  no murmurs, no rubs, no gallops ?RESPIRATORY:  Clear to auscultation without rales, wheezing or rhonchi  ?ABDOMEN: Soft, non-tender, non-distended ?MUSCULOSKELETAL:  No edema; No deformity  ?SKIN: Warm and dry ?NEUROLOGIC:  Alert and oriented x 3 ?PSYCHIATRIC:  Normal affect  ? ?ASSESSMENT:   ? ?1. Abnormal EKG   ?2. Palpitations   ?3. Right bundle branch block   ?4. Mixed hyperlipidemia   ? ?PLAN:   ? ?In order of problems listed above: ? ?Palpitations ?PVCs seen on EKG. he is currently wearing a ZIO  monitor.  We will review results when available.  On exam today I do hear occasional ectopy may be every fifth beat or so.  Usually we will proceed with conservative management in the situation.  He is taking low-dose carvedilol 3.125 mg twice a day.  He is not having any high risk symptoms such as syncope.  He does feel the palpitations however.  I have asked him to discontinue his 1 cup of coffee and change it to decaf.  He is not taking any decongestants. ? ?Ultimately with the monitor, we want to make sure he is not having any atrial fibrillation which would change our management strategy. ? ?Right bundle branch block ?We will go ahead and check an echocardiogram to ensure proper structure and function.  Explained to him that right bundle branch block is usually benign.  It does marked conduction system issue.  If heart rate were to markedly decrease consistently and he was symptomatic with this, pacemaker may be warranted in the future however this is rarely the situation. ? ?Mixed hyperlipidemia ?He is taking fish oil over-the-counter.  His LDL was quite elevated at 174 at last check.  Diet, exercise. ? ? ? ?  ? ?Follow-up: 1 year. ? ?Medication Adjustments/Labs and Tests Ordered: ?Current medicines are reviewed at length with the patient today.  Concerns regarding medicines are outlined above.  ? ?Orders Placed This Encounter  ?Procedures  ? ECHOCARDIOGRAM COMPLETE  ? ?No orders of the defined types were placed  in this encounter. ? ?Patient Instructions  ?Medication Instructions:  ?The current medical regimen is effective;  continue present plan and medications. ? ?*If you need a refill on your cardiac medications befor

## 2021-09-16 NOTE — Assessment & Plan Note (Signed)
We will go ahead and check an echocardiogram to ensure proper structure and function.  Explained to him that right bundle branch block is usually benign.  It does marked conduction system issue.  If heart rate were to markedly decrease consistently and he was symptomatic with this, pacemaker may be warranted in the future however this is rarely the situation. ?

## 2021-09-16 NOTE — Patient Instructions (Signed)
Medication Instructions:  ?The current medical regimen is effective;  continue present plan and medications. ? ?*If you need a refill on your cardiac medications before your next appointment, please call your pharmacy* ? ?Testing/Procedures: ?Your physician has requested that you have an echocardiogram. Echocardiography is a painless test that uses sound waves to create images of your heart. It provides your doctor with information about the size and shape of your heart and how well your heart?s chambers and valves are working. This procedure takes approximately one hour. There are no restrictions for this procedure. ? ?Follow-Up: ?At [facility], you and your health needs are our priority.  As part of our continuing mission to provide you with exceptional heart care, we have created designated Provider Care Teams.  These Care Teams include your primary Cardiologist (physician) and Advanced Practice Providers (APPs -  Physician Assistants and Nurse Practitioners) who all work together to provide you with the care you need, when you need it. ? ?We recommend signing up for the patient portal called "MyChart".  Sign up information is provided on this After Visit Summary.  MyChart is used to connect with patients for Virtual Visits (Telemedicine).  Patients are able to view lab/test results, encounter notes, upcoming appointments, etc.  Non-urgent messages can be sent to your provider as well.   ?To learn more about what you can do with MyChart, go to [email or web address].   ? ?Your next appointment:   ?1 year(s) ? ?The format for your next appointment:   ?In Person ? ?Provider:   ?Dr Candee Furbish ? ?If primary card or EP is not listed click here to update    :1}  ? ? ?Thank you for choosing [facility]!! ? ? ? ?

## 2021-09-16 NOTE — Assessment & Plan Note (Addendum)
PVCs seen on EKG. he is currently wearing a ZIO monitor.  We will review results when available.  On exam today I do hear occasional ectopy may be every fifth beat or so.  Usually we will proceed with conservative management in the situation.  He is taking low-dose carvedilol 3.125 mg twice a day.  He is not having any high risk symptoms such as syncope.  He does feel the palpitations however.  I have asked him to discontinue his 1 cup of coffee and change it to decaf.  He is not taking any decongestants. ? ?Ultimately with the monitor, we want to make sure he is not having any atrial fibrillation which would change our management strategy. ?

## 2021-09-19 ENCOUNTER — Encounter: Payer: Self-pay | Admitting: Cardiology

## 2021-09-23 ENCOUNTER — Ambulatory Visit: Payer: Self-pay | Admitting: Family Medicine

## 2021-09-23 ENCOUNTER — Ambulatory Visit: Payer: Medicare Other | Admitting: Family Medicine

## 2021-10-02 ENCOUNTER — Ambulatory Visit (HOSPITAL_COMMUNITY): Payer: Medicare Other | Attending: Cardiology

## 2021-10-02 ENCOUNTER — Encounter (HOSPITAL_COMMUNITY): Payer: Self-pay

## 2021-10-10 ENCOUNTER — Encounter: Payer: Self-pay | Admitting: Family Medicine

## 2021-10-15 ENCOUNTER — Ambulatory Visit (HOSPITAL_COMMUNITY): Payer: Medicare Other | Attending: Cardiovascular Disease

## 2021-10-15 DIAGNOSIS — R9431 Abnormal electrocardiogram [ECG] [EKG]: Secondary | ICD-10-CM | POA: Insufficient documentation

## 2021-10-15 LAB — ECHOCARDIOGRAM COMPLETE
Area-P 1/2: 2.96 cm2
S' Lateral: 2.7 cm

## 2021-10-20 ENCOUNTER — Encounter: Payer: Self-pay | Admitting: Family Medicine

## 2021-10-20 ENCOUNTER — Other Ambulatory Visit: Payer: Self-pay | Admitting: Family Medicine

## 2021-10-20 DIAGNOSIS — I10 Essential (primary) hypertension: Secondary | ICD-10-CM

## 2021-10-20 MED ORDER — CARVEDILOL 6.25 MG PO TABS: 6.2500 mg | ORAL_TABLET | Freq: Two times a day (BID) | ORAL | 2 refills | Status: DC

## 2021-10-20 NOTE — Progress Notes (Signed)
Test reviewed. Result given to patient explaining SVT. Increased dose of carvedilol ordered. One month follow up recommended.  ?Claretta Fraise, MD ? ?

## 2021-11-19 ENCOUNTER — Ambulatory Visit (INDEPENDENT_AMBULATORY_CARE_PROVIDER_SITE_OTHER): Payer: Medicare Other | Admitting: Family Medicine

## 2021-11-19 ENCOUNTER — Encounter: Payer: Self-pay | Admitting: Family Medicine

## 2021-11-19 VITALS — BP 128/71 | HR 66 | Temp 97.8°F | Ht 67.0 in | Wt 180.8 lb

## 2021-11-19 DIAGNOSIS — C61 Malignant neoplasm of prostate: Secondary | ICD-10-CM | POA: Diagnosis not present

## 2021-11-19 DIAGNOSIS — I8393 Asymptomatic varicose veins of bilateral lower extremities: Secondary | ICD-10-CM

## 2021-11-19 DIAGNOSIS — I10 Essential (primary) hypertension: Secondary | ICD-10-CM | POA: Diagnosis not present

## 2021-11-19 DIAGNOSIS — E782 Mixed hyperlipidemia: Secondary | ICD-10-CM

## 2021-11-19 MED ORDER — AMLODIPINE BESYLATE 10 MG PO TABS
10.0000 mg | ORAL_TABLET | Freq: Every day | ORAL | 3 refills | Status: DC
Start: 2021-11-19 — End: 2022-11-16

## 2021-11-19 NOTE — Progress Notes (Signed)
Subjective:  Patient ID: Aaron Whitney, male    DOB: Jul 07, 1935  Age: 86 y.o. MRN: [id number]  CC: Medical Management of Chronic Issues   HPI Aaron Whitney presents for  presents for  follow-up of hypertension. Patient has no history of headache chest pain or shortness of breath or recent cough. Patient also denies symptoms of TIA such as focal numbness or weakness. Patient denies side effects from medication. States taking it regularly.   in for follow-up of elevated cholesterol. Doing well without complaints on current medication. Denies side effects of statin including myalgia and arthralgia and nausea. Currently no chest pain, shortness of breath or other cardiovascular related symptoms noted.       11/19/2021   10:32 AM 05/21/2021    9:51 AM 11/21/2020    1:57 PM  Depression screen PHQ 2/9  Decreased Interest 0 0 0  Down, Depressed, Hopeless 0 0 0  PHQ - 2 Score 0 0 0  Altered sleeping   0  Tired, decreased energy   0  Change in appetite   0  Feeling bad or failure about yourself    0  Trouble concentrating   0  Moving slowly or fidgety/restless   0  Suicidal thoughts   0  PHQ-9 Score   0  Difficult doing work/chores   Not difficult at all    History Aaron Whitney has a past medical history of Cancer ([place]) (2013), Hypertension, and Macular degeneration.   He has a past surgical history that includes Prostate surgery.   His family history includes Alcohol abuse in his father; Cancer in his mother.He reports that he has never smoked. He has never used smokeless tobacco. He reports that he does not drink alcohol and does not use drugs.    ROS Review of Systems  Constitutional:  Negative for fever.  Respiratory:  Negative for shortness of breath.   Cardiovascular:  Negative for chest pain.  Musculoskeletal:  Negative for arthralgias.  Skin:  Negative for rash.    Objective:  BP 128/71   Pulse 66   Temp 97.8 F (36.6 C)   Ht '5\' 7"'  (1.702 m)   Wt 180  lb 12.8 oz (82 kg)   SpO2 95%   BMI 28.32 kg/m   BP Readings from Last 3 Encounters:  11/19/21 128/71  09/16/21 110/68  09/09/21 130/77    Wt Readings from Last 3 Encounters:  11/19/21 180 lb 12.8 oz (82 kg)  09/16/21 182 lb (82.6 kg)  09/09/21 182 lb 6.4 oz (82.7 kg)     Physical Exam Vitals reviewed.  Constitutional:      Appearance: He is well-developed.  HENT:     Head: Normocephalic and atraumatic.     Right Ear: External ear normal.     Left Ear: External ear normal.     Mouth/Throat:     Pharynx: No oropharyngeal exudate or posterior oropharyngeal erythema.  Eyes:     Pupils: Pupils are equal, round, and reactive to light.  Cardiovascular:     Rate and Rhythm: Normal rate and regular rhythm. Occasional Extrasystoles are present.    Chest Wall: PMI is not displaced.     Heart sounds: No murmur heard.    Comments: Moderate varicosities at distal legs Pulmonary:     Effort: No respiratory distress.     Breath sounds: Normal breath sounds.  Musculoskeletal:     Cervical back: Normal range of motion and neck supple.     Right lower  leg: No edema.     Left lower leg: No edema.  Neurological:     Mental Status: He is alert and oriented to person, place, and time.       Assessment & Plan:   Aaron Whitney was seen today for medical management of chronic issues.  Diagnoses and all orders for this visit:  Essential hypertension -     CBC with Differential/Platelet -     CMP14+EGFR  Mixed hyperlipidemia -     Lipid panel  CA of prostate (HCC) -     PSA, total and free  Asymptomatic varicose veins of both lower extremities  Other orders -     amLODipine (NORVASC) 10 MG tablet; Take 1 tablet (10 mg total) by mouth daily.       I have changed Aaron Poot. Jetter "[name]"'s amLODipine. I am also having him maintain his Osteo Bi-Flex Triple Strength, tamsulosin, and carvedilol.  Allergies as of 11/19/2021   No Known Allergies      Medication List         Accurate as of November 19, 2021 11:59 PM. If you have any questions, ask your nurse or doctor.          amLODipine 10 MG tablet Commonly known as: NORVASC Take 1 tablet (10 mg total) by mouth daily.   carvedilol 6.25 MG tablet Commonly known as: COREG Take 1 tablet (6.25 mg total) by mouth 2 (two) times daily with a meal.   Osteo Bi-Flex Triple Strength Tabs Take 1 tablet by mouth 2 (two) times daily.   tamsulosin 0.4 MG Caps capsule Commonly known as: FLOMAX Take 2 capsules (0.8 mg total) by mouth daily.         Follow-up: Return in about 6 months (around 05/21/2022) for [facility] physical.  Aaron Whitney, M.D.

## 2021-11-20 LAB — CBC WITH DIFFERENTIAL/PLATELET
Basophils Absolute: 0.1 10*3/uL (ref 0.0–0.2)
Basos: 1 %
EOS (ABSOLUTE): 0.3 10*3/uL (ref 0.0–0.4)
Eos: 4 %
Hematocrit: 45 % (ref 37.5–51.0)
Hemoglobin: 15.5 g/dL (ref 13.0–17.7)
Immature Grans (Abs): 0 10*3/uL (ref 0.0–0.1)
Immature Granulocytes: 1 %
Lymphocytes Absolute: 2.2 10*3/uL (ref 0.7–3.1)
Lymphs: 25 %
MCH: 30.8 pg (ref 26.6–33.0)
MCHC: 34.4 g/dL (ref 31.5–35.7)
MCV: 89 fL (ref 79–97)
Monocytes Absolute: 0.7 10*3/uL (ref 0.1–0.9)
Monocytes: 8 %
Neutrophils Absolute: 5.4 10*3/uL (ref 1.4–7.0)
Neutrophils: 61 %
Platelets: 215 10*3/uL (ref 150–450)
RBC: 5.04 x10E6/uL (ref 4.14–5.80)
RDW: 13.1 % (ref 11.6–15.4)
WBC: 8.8 10*3/uL (ref 3.4–10.8)

## 2021-11-20 LAB — CMP14+EGFR
ALT: 15 IU/L (ref 0–44)
AST: 21 IU/L (ref 0–40)
Albumin/Globulin Ratio: 2 (ref 1.2–2.2)
Albumin: 4.5 g/dL (ref 3.6–4.6)
Alkaline Phosphatase: 62 IU/L (ref 44–121)
BUN/Creatinine Ratio: 14 (ref 10–24)
BUN: 13 mg/dL (ref 8–27)
Bilirubin Total: 0.8 mg/dL (ref 0.0–1.2)
CO2: 25 mmol/L (ref 20–29)
Calcium: 10 mg/dL (ref 8.6–10.2)
Chloride: 97 mmol/L (ref 96–106)
Creatinine, Ser: 0.96 mg/dL (ref 0.76–1.27)
Globulin, Total: 2.3 g/dL (ref 1.5–4.5)
Glucose: 109 mg/dL — ABNORMAL HIGH (ref 70–99)
Potassium: 4.8 mmol/L (ref 3.5–5.2)
Sodium: 141 mmol/L (ref 134–144)
Total Protein: 6.8 g/dL (ref 6.0–8.5)
eGFR: 78 mL/min/{1.73_m2} (ref >59)

## 2021-11-20 LAB — LIPID PANEL
Chol/HDL Ratio: 5.2 ratio — ABNORMAL HIGH (ref 0.0–5.0)
Cholesterol, Total: 230 mg/dL — ABNORMAL HIGH (ref 100–199)
HDL: 44 mg/dL (ref >39)
LDL Chol Calc (NIH): 173 mg/dL — ABNORMAL HIGH (ref 0–99)
Triglycerides: 76 mg/dL (ref 0–149)
VLDL Cholesterol Cal: 13 mg/dL (ref 5–40)

## 2021-11-20 LAB — PSA, TOTAL AND FREE
PSA, Free Pct: 6.7 %
PSA, Free: 0.14 ng/mL
Prostate Specific Ag, Serum: 2.1 ng/mL (ref 0.0–4.0)

## 2021-11-22 ENCOUNTER — Encounter: Payer: Self-pay | Admitting: Family Medicine

## 2021-11-24 ENCOUNTER — Ambulatory Visit: Payer: Medicare Other

## 2021-11-24 ENCOUNTER — Other Ambulatory Visit: Payer: Self-pay | Admitting: Family Medicine

## 2021-11-24 MED ORDER — ROSUVASTATIN CALCIUM 5 MG PO TABS: 5.0000 mg | ORAL_TABLET | Freq: Every day | ORAL | 3 refills | Status: DC

## 2021-12-02 ENCOUNTER — Ambulatory Visit (INDEPENDENT_AMBULATORY_CARE_PROVIDER_SITE_OTHER): Payer: Medicare Other

## 2021-12-02 VITALS — Wt 180.0 lb

## 2021-12-02 DIAGNOSIS — Z0000 Encounter for general adult medical examination without abnormal findings: Secondary | ICD-10-CM | POA: Diagnosis not present

## 2021-12-02 NOTE — Patient Instructions (Signed)
Aaron Whitney , Thank you for taking time to come for your Medicare Wellness Visit. I appreciate your ongoing commitment to your health goals. Please review the following plan we discussed and let me know if I can assist you in the future.   Screening recommendations/referrals: Colonoscopy: Done 03/15/2014 - no repeat required Recommended yearly ophthalmology/optometry visit for glaucoma screening and checkup Recommended yearly dental visit for hygiene and checkup  Vaccinations: Influenza vaccine: Declined - recommended every fall Pneumococcal vaccine: done  10/05/2017 & 11/19/2020   Tdap vaccine: Done 12/21/2012 - Repeat in 10 years Shingles vaccine: Due - Shingrix is 2 doses 2-6 months apart and over 90% effective     Covid-19: Done Janssen 08/27/2019 & [place] 06/17/2020  Advanced directives: Please bring a copy of your health care power of attorney and living will to the office to be added to your chart at your convenience.   Conditions/risks identified: Keep up the great work! Aim for 30 minutes of exercise or brisk walking, 6-8 glasses of water, and 5 servings of fruits and vegetables each day.   Next appointment: Follow up in one year for your annual wellness visit.   Preventive Care 42 Years and Older, Male  Preventive care refers to lifestyle choices and visits with your health care provider that can promote health and wellness. What does preventive care include? A yearly physical exam. This is also called an annual well check. Dental exams once or twice a year. Routine eye exams. Ask your health care provider how often you should have your eyes checked. Personal lifestyle choices, including: Daily care of your teeth and gums. Regular physical activity. Eating a healthy diet. Avoiding tobacco and drug use. Limiting alcohol use. Practicing safe sex. Taking low doses of aspirin every day. Taking vitamin and mineral supplements as recommended by your health care provider. What  happens during an annual well check? The services and screenings done by your health care provider during your annual well check will depend on your age, overall health, lifestyle risk factors, and family history of disease. Counseling  Your health care provider may ask you questions about your: Alcohol use. Tobacco use. Drug use. Emotional well-being. Home and relationship well-being. Sexual activity. Eating habits. History of falls. Memory and ability to understand (cognition). Work and work Statistician. Screening  You may have the following tests or measurements: Height, weight, and BMI. Blood pressure. Lipid and cholesterol levels. These may be checked every 5 years, or more frequently if you are over 1 years old. Skin check. Lung cancer screening. You may have this screening every year starting at age 28 if you have a 30-pack-year history of smoking and currently smoke or have quit within the past 15 years. Fecal occult blood test (FOBT) of the stool. You may have this test every year starting at age 42. Flexible sigmoidoscopy or colonoscopy. You may have a sigmoidoscopy every 5 years or a colonoscopy every 10 years starting at age 4. Prostate cancer screening. Recommendations will vary depending on your family history and other risks. Hepatitis C blood test. Hepatitis B blood test. Sexually transmitted disease (STD) testing. Diabetes screening. This is done by checking your blood sugar (glucose) after you have not eaten for a while (fasting). You may have this done every 1-3 years. Abdominal aortic aneurysm (AAA) screening. You may need this if you are a current or former smoker. Osteoporosis. You may be screened starting at age 75 if you are at high risk. Talk with your health care provider  about your test results, treatment options, and if necessary, the need for more tests. Vaccines  Your health care provider may recommend certain vaccines, such as: Influenza vaccine. This  is recommended every year. Tetanus, diphtheria, and acellular pertussis (Tdap, Td) vaccine. You may need a Td booster every 10 years. Zoster vaccine. You may need this after age 58. Pneumococcal 13-valent conjugate (PCV13) vaccine. One dose is recommended after age 48. Pneumococcal polysaccharide (PPSV23) vaccine. One dose is recommended after age 69. Talk to your health care provider about which screenings and vaccines you need and how often you need them. This information is not intended to replace advice given to you by your health care provider. Make sure you discuss any questions you have with your health care provider. Document Released: 06/28/2015 Document Revised: 02/19/2016 Document Reviewed: 04/02/2015 Elsevier Interactive Patient Education  2017 [place] Prevention in the Home Falls can cause injuries. They can happen to people of all ages. There are many things you can do to make your home safe and to help prevent falls. What can I do on the outside of my home? Regularly fix the edges of walkways and driveways and fix any cracks. Remove anything that might make you trip as you walk through a door, such as a raised step or threshold. Trim any bushes or trees on the path to your home. Use bright outdoor lighting. Clear any walking paths of anything that might make someone trip, such as rocks or tools. Regularly check to see if handrails are loose or broken. Make sure that both sides of any steps have handrails. Any raised decks and porches should have guardrails on the edges. Have any leaves, snow, or ice cleared regularly. Use sand or salt on walking paths during winter. Clean up any spills in your garage right away. This includes oil or grease spills. What can I do in the bathroom? Use night lights. Install grab bars by the toilet and in the tub and shower. Do not use towel bars as grab bars. Use non-skid mats or decals in the tub or shower. If you need to sit down  in the shower, use a plastic, non-slip stool. Keep the floor dry. Clean up any water that spills on the floor as soon as it happens. Remove soap buildup in the tub or shower regularly. Attach bath mats securely with double-sided non-slip rug tape. Do not have throw rugs and other things on the floor that can make you trip. What can I do in the bedroom? Use night lights. Make sure that you have a light by your bed that is easy to reach. Do not use any sheets or blankets that are too big for your bed. They should not hang down onto the floor. Have a firm chair that has side arms. You can use this for support while you get dressed. Do not have throw rugs and other things on the floor that can make you trip. What can I do in the kitchen? Clean up any spills right away. Avoid walking on wet floors. Keep items that you use a lot in easy-to-reach places. If you need to reach something above you, use a strong step stool that has a grab bar. Keep electrical cords out of the way. Do not use floor polish or wax that makes floors slippery. If you must use wax, use non-skid floor wax. Do not have throw rugs and other things on the floor that can make you trip. What can I do  with my stairs? Do not leave any items on the stairs. Make sure that there are handrails on both sides of the stairs and use them. Fix handrails that are broken or loose. Make sure that handrails are as long as the stairways. Check any carpeting to make sure that it is firmly attached to the stairs. Fix any carpet that is loose or worn. Avoid having throw rugs at the top or bottom of the stairs. If you do have throw rugs, attach them to the floor with carpet tape. Make sure that you have a light switch at the top of the stairs and the bottom of the stairs. If you do not have them, ask someone to add them for you. What else can I do to help prevent falls? Wear shoes that: Do not have high heels. Have rubber bottoms. Are comfortable  and fit you well. Are closed at the toe. Do not wear sandals. If you use a stepladder: Make sure that it is fully opened. Do not climb a closed stepladder. Make sure that both sides of the stepladder are locked into place. Ask someone to hold it for you, if possible. Clearly mark and make sure that you can see: Any grab bars or handrails. First and last steps. Where the edge of each step is. Use tools that help you move around (mobility aids) if they are needed. These include: Canes. Walkers. Scooters. Crutches. Turn on the lights when you go into a dark area. Replace any light bulbs as soon as they burn out. Set up your furniture so you have a clear path. Avoid moving your furniture around. If any of your floors are uneven, fix them. If there are any pets around you, be aware of where they are. Review your medicines with your doctor. Some medicines can make you feel dizzy. This can increase your chance of falling. Ask your doctor what other things that you can do to help prevent falls. This information is not intended to replace advice given to you by your health care provider. Make sure you discuss any questions you have with your health care provider. Document Released: 03/28/2009 Document Revised: 11/07/2015 Document Reviewed: 07/06/2014 Elsevier Interactive Patient Education  2017 [employer].

## 2021-12-02 NOTE — Progress Notes (Signed)
Subjective:   Aaron Whitney is a 86 y.o. male who presents for Medicare Annual/Subsequent preventive examination.  Virtual Visit via Telephone Note  I connected with  Aaron Whitney on 12/02/21 at  2:45 PM EDT by telephone and verified that I am speaking with the correct person using two identifiers.  Location: Patient: Home Provider: [facility] Persons participating in the virtual visit: patient/Nurse Health Advisor   I discussed the limitations, risks, security and privacy concerns of performing an evaluation and management service by telephone and the availability of in person appointments. The patient expressed understanding and agreed to proceed.  Interactive audio and video telecommunications were attempted between this nurse and patient, however failed, due to patient having technical difficulties OR patient did not have access to video capability.  We continued and completed visit with audio only.  Some vital signs may be absent or patient reported.   Jaymar Loeber E Ginelle Bays, LPN   Review of Systems     Cardiac Risk Factors include: advanced age (>73mn, >>91women);dyslipidemia;hypertension;male gender     Objective:    Today's Vitals   12/02/21 1450  Weight: 180 lb (81.6 kg)   Body mass index is 28.19 kg/m.     12/02/2021    2:54 PM 11/21/2020    2:00 PM 11/21/2019    1:23 PM  Advanced Directives  Does Patient Have a Medical Advance Directive? Yes Yes Yes  Type of AParamedicof ACross [place]Living will [place] will HMount [place] VillageLiving will  Does patient want to make changes to medical advance directive?   No - Patient declined  Copy of [place] Chart? No - copy requested No - copy requested   Would patient like information on creating a medical advance directive?   No - Patient declined    Current Medications (verified) Outpatient Encounter Medications as of 12/02/2021   Medication Sig   amLODipine (NORVASC) 10 MG tablet Take 1 tablet (10 mg total) by mouth daily.   carvedilol (COREG) 6.25 MG tablet Take 1 tablet (6.25 mg total) by mouth 2 (two) times daily with a meal.   Misc Natural Products (OSTEO BI-FLEX TRIPLE STRENGTH) TABS Take 1 tablet by mouth 2 (two) times daily.   rosuvastatin (CRESTOR) 5 MG tablet Take 1 tablet (5 mg total) by mouth daily. For cholesterol   tamsulosin (FLOMAX) 0.4 MG CAPS capsule Take 2 capsules (0.8 mg total) by mouth daily.   No facility-administered encounter medications on file as of 12/02/2021.    Allergies (verified) Patient has no known allergies.   History: Past Medical History:  Diagnosis Date   Cancer ([place] 2013   prostate   Hypertension    Macular degeneration    Past Surgical History:  Procedure Laterality Date   PROSTATE SURGERY     Family History  Problem Relation Age of Onset   Cancer Mother    Alcohol abuse Father    Social History   Socioeconomic History   Marital status: Married    Spouse name: FJoanne Chars  Number of children: 4   Years of education: Not on file   Highest education level: Not on file  Occupational History   Occupation: retired  Tobacco Use   Smoking status: Never   Smokeless tobacco: Never  Substance and Sexual Activity   Alcohol use: No    Alcohol/week: 0.0 standard drinks of alcohol   Drug use: No   Sexual activity: Not on file  Other  Topics Concern   Not on file  Social History Narrative   Lives at home with wife. Family lives nearby.    Stays active, exercising 3-4 times per week. Actively involved in church.   Hx of prostate cancer - seeing Dr Tommi Rumps for routine f/u   Social Determinants of Health   Financial Resource Strain: Low Risk  (12/02/2021)   Overall Financial Resource Strain (CARDIA)    Difficulty of Paying Living Expenses: Not hard at all  Food Insecurity: No Food Insecurity (12/02/2021)   Hunger Vital Sign    Worried About Running Out of Food in  the Last Year: Never true    Ran Out of Food in the Last Year: Never true  Transportation Needs: No Transportation Needs (12/02/2021)   PRAPARE - Hydrologist (Medical): No    Lack of Transportation (Non-Medical): No  Physical Activity: Sufficiently Active (12/02/2021)   Exercise Vital Sign    Days of Exercise per Week: 3 days    Minutes of Exercise per Session: 60 min  Stress: No Stress Concern Present (12/02/2021)   [place]    Feeling of Stress : Not at all  Social Connections: [place] (12/02/2021)   Social Connection and Isolation Panel [NHANES]    Frequency of Communication with Friends and Family: More than three times a week    Frequency of Social Gatherings with Friends and Family: More than three times a week    Attends Religious Services: More than 4 times per year    Active Member of [employer] or Organizations: Yes    Attends Music therapist: More than 4 times per year    Marital Status: Married    Tobacco Counseling Counseling given: Not Answered   Clinical Intake:  Pre-visit preparation completed: Yes  Pain : No/denies pain     BMI - recorded: 28.19 Nutritional Status: BMI 25 -29 Overweight Nutritional Risks: None Diabetes: No  How often do you need to have someone help you when you read instructions, pamphlets, or other written materials from your doctor or pharmacy?: 1 - Never  Diabetic? no  Interpreter Needed?: No  Information entered by :: Aleksa Collinsworth, LPN   Activities of Daily Living    12/02/2021    2:54 PM  In your present state of health, do you have any difficulty performing the following activities:  Hearing? 0  Vision? 0  Difficulty concentrating or making decisions? 0  Walking or climbing stairs? 0  Dressing or bathing? 0  Doing errands, shopping? 0  Preparing Food and eating ? N  Using the Toilet? N  In the past six  months, have you accidently leaked urine? N  Do you have problems with loss of bowel control? N  Managing your Medications? N  Managing your Finances? N  Housekeeping or managing your Housekeeping? N    Patient Care Team: Claretta Fraise, MD as PCP - General (Family Medicine) Jerline Pain, MD as PCP - Cardiology (Cardiology) Gwendel Hanson, MD as Referring Physician (Urology)  Indicate any recent Medical Services you may have received from other than [facility] providers in the past year (date may be approximate).     Assessment:   This is a routine wellness examination for Aaron Whitney.  Hearing/Vision screen Hearing Screening - Comments:: Denies hearing difficulties   Vision Screening - Comments:: Wears rx glasses - up to date with routine eye exams with [facility] [place]  issues and exercise activities discussed: Current Exercise Habits: Home exercise routine, Type of exercise: walking;strength training/weights;stretching, Time (Minutes): 60, Frequency (Times/Week): 3, Weekly Exercise (Minutes/Week): 180, Intensity: Moderate, Exercise limited by: None identified   Goals Addressed             This Visit's Progress    Reduce sugar intake to X grams per day   On track    Follow wife's lead on portions of desserts, etc.       Depression Screen    12/02/2021    2:53 PM 11/19/2021   10:32 AM 05/21/2021    9:51 AM 11/21/2020    1:57 PM 11/19/2020    9:18 AM 11/19/2020    9:09 AM 07/16/2020   10:01 AM  PHQ 2/9 Scores  PHQ - 2 Score 0 0 0 0 0 0 0  PHQ- 9 Score    0 1      Fall Risk    12/02/2021    2:52 PM 11/19/2021   10:32 AM 05/21/2021    9:51 AM 11/21/2020    2:00 PM 11/19/2020    9:09 AM  [place] in the past year? 0 0 0 1 0  Number falls in past yr: 0   0   Injury with Fall? 0   1   Risk for fall due to : No Fall Risks   Impaired vision;History of fall(s)   Follow up Falls prevention discussed   Falls prevention discussed;Education provided     FALL  RISK PREVENTION PERTAINING TO THE HOME:  Any stairs in or around the home? No  If so, are there any without handrails? No  Home free of loose throw rugs in walkways, pet beds, electrical cords, etc? Yes  Adequate lighting in your home to reduce risk of falls? Yes   ASSISTIVE DEVICES UTILIZED TO PREVENT FALLS:  Life alert? No  Use of a cane, walker or w/c? No  Grab bars in the bathroom? Yes  Shower chair or bench in shower? No  Elevated toilet seat or a handicapped toilet? No   TIMED UP AND GO:  Was the test performed? No . Telephonic visit  Cognitive Function:        12/02/2021    2:55 PM 11/21/2020    1:38 PM 11/21/2019    1:27 PM  6CIT Screen  What Year? 0 points 0 points 0 points  What month? 0 points 0 points 0 points  What time? 0 points 0 points 0 points  Count back from 20 0 points 0 points 0 points  Months in reverse 0 points 0 points 0 points  Repeat phrase 6 points 0 points 2 points  Total Score 6 points 0 points 2 points    Immunizations Immunization History  Administered Date(s) Administered   Janssen (J&J) SARS-COV-2 Vaccination 08/27/2019   PFIZER(Purple Top)SARS-COV-2 Vaccination 06/17/2020   Pneumococcal Conjugate-13 09/04/2014, 11/19/2020   Pneumococcal Polysaccharide-23 12/13/2012, 10/05/2017   Tdap 12/21/2012    TDAP status: Up to date  Flu Vaccine status: Declined, Education has been provided regarding the importance of this vaccine but patient still declined. Advised may receive this vaccine at local pharmacy or Health Dept. Aware to provide a copy of the vaccination record if obtained from local pharmacy or Health Dept. Verbalized acceptance and understanding.  Pneumococcal vaccine status: Up to date  Covid-19 vaccine status: Completed vaccines  Qualifies for Shingles Vaccine? Yes   Zostavax completed No   Shingrix Completed?: No.  Education has been provided regarding the importance of this vaccine. Patient has been advised to call  insurance company to determine out of pocket expense if they have not yet received this vaccine. Advised may also receive vaccine at local pharmacy or Health Dept. Verbalized acceptance and understanding.  Screening Tests Health Maintenance  Topic Date Due   COVID-19 Vaccine (3 - Booster for Janssen series) 12/05/2021 (Originally 08/12/2020)   Zoster Vaccines- Shingrix (1 of 2) 02/19/2022 (Originally 10/02/1954)   INFLUENZA VACCINE  01/13/2022   TETANUS/TDAP  12/22/2022   Pneumonia Vaccine 33+ Years old  Completed   HPV VACCINES  Aged Out    Health Maintenance  There are no preventive care reminders to display for this patient.  Colorectal cancer screening: No longer required.   Lung Cancer Screening: (Low Dose CT Chest recommended if Age 87-80 years, 30 pack-year currently smoking OR have quit w/in 15years.) does not qualify  Additional Screening:  Hepatitis C Screening: does not qualify  Vision Screening: Recommended annual ophthalmology exams for early detection of glaucoma and other disorders of the eye. Is the patient up to date with their annual eye exam?  Yes  Who is the provider or what is the name of the office in which the patient attends annual eye exams? [facility] If pt is not established with a provider, would they like to be referred to a provider to establish care? No .   Dental Screening: Recommended annual dental exams for proper oral hygiene  Community Resource Referral / Chronic Care Management: CRR required this visit?  No   CCM required this visit?  No      Plan:     I have personally reviewed and noted the following in the patient's chart:   Medical and social history Use of alcohol, tobacco or illicit drugs  Current medications and supplements including opioid prescriptions. Patient is not currently taking opioid prescriptions. Functional ability and status Nutritional status Physical activity Advanced directives List of other  physicians Hospitalizations, surgeries, and ER visits in previous 12 months Vitals Screenings to include cognitive, depression, and falls Referrals and appointments  In addition, I have reviewed and discussed with patient certain preventive protocols, quality metrics, and best practice recommendations. A written personalized care plan for preventive services as well as general preventive health recommendations were provided to patient.     Sandrea Hammond, LPN   07/27/9415   Nurse Notes: 6CIT = 6

## 2022-01-22 ENCOUNTER — Other Ambulatory Visit: Payer: Self-pay | Admitting: Family Medicine

## 2022-01-22 DIAGNOSIS — I10 Essential (primary) hypertension: Secondary | ICD-10-CM

## 2022-01-26 ENCOUNTER — Other Ambulatory Visit: Payer: Self-pay | Admitting: Family Medicine

## 2022-01-26 DIAGNOSIS — N401 Enlarged prostate with lower urinary tract symptoms: Secondary | ICD-10-CM

## 2022-03-25 ENCOUNTER — Encounter: Payer: Self-pay | Admitting: Family Medicine

## 2022-04-06 ENCOUNTER — Encounter: Payer: Self-pay | Admitting: Family Medicine

## 2022-04-06 ENCOUNTER — Ambulatory Visit (INDEPENDENT_AMBULATORY_CARE_PROVIDER_SITE_OTHER): Payer: Medicare Other | Admitting: Family Medicine

## 2022-04-06 ENCOUNTER — Other Ambulatory Visit: Payer: Medicare Other

## 2022-04-06 VITALS — BP 130/71 | HR 70 | Temp 97.5°F | Ht 67.0 in | Wt 179.6 lb

## 2022-04-06 DIAGNOSIS — R002 Palpitations: Secondary | ICD-10-CM | POA: Diagnosis not present

## 2022-04-06 DIAGNOSIS — R351 Nocturia: Secondary | ICD-10-CM

## 2022-04-06 DIAGNOSIS — I8391 Asymptomatic varicose veins of right lower extremity: Secondary | ICD-10-CM

## 2022-04-06 DIAGNOSIS — I10 Essential (primary) hypertension: Secondary | ICD-10-CM | POA: Diagnosis not present

## 2022-04-06 DIAGNOSIS — E782 Mixed hyperlipidemia: Secondary | ICD-10-CM

## 2022-04-06 DIAGNOSIS — N401 Enlarged prostate with lower urinary tract symptoms: Secondary | ICD-10-CM

## 2022-04-06 MED ORDER — TAMSULOSIN HCL 0.4 MG PO CAPS: 0.8000 mg | ORAL_CAPSULE | Freq: Every day | ORAL | 3 refills | Status: DC

## 2022-04-06 NOTE — Addendum Note (Signed)
Addended by: Baldomero Lamy B on: 04/06/2022 01:23 PM   Modules accepted: Orders

## 2022-04-06 NOTE — Progress Notes (Signed)
Subjective:  Patient ID: Aaron Whitney, male    DOB: Oct 03, 1935  Age: 86 y.o. MRN: [id number]  CC: Medical Management of Chronic Issues   HPI Aaron Whitney presents for recheck of palpitations. Recently has had an increase in the incidence of early beats. Not associated with syncope chest pain, dyspnea or dizziness. Occurring randomly through the day, daily.   Multiple varicose veins RLE at shin & calf. Less at left. Would like to have them removed     04/06/2022   11:31 AM 12/02/2021    2:53 PM 11/19/2021   10:32 AM  Depression screen PHQ 2/9  Decreased Interest 0 0 0  Down, Depressed, Hopeless 0 0 0  PHQ - 2 Score 0 0 0    History Aaron Whitney has a past medical history of Cancer ([place]) (2013), Hypertension, and Macular degeneration.   He has a past surgical history that includes Prostate surgery.   His family history includes Alcohol abuse in his father; Cancer in his mother.He reports that he has never smoked. He has never used smokeless tobacco. He reports that he does not drink alcohol and does not use drugs.    ROS Review of Systems  Constitutional:  Negative for fever.  Respiratory:  Negative for shortness of breath.   Cardiovascular:  Positive for palpitations. Negative for chest pain.  Musculoskeletal:  Negative for arthralgias.  Skin:  Negative for rash.    Objective:  BP 130/71   Pulse 70   Temp (!) 97.5 F (36.4 C)   Ht '5\' 7"'  (1.702 m)   Wt 179 lb 9.6 oz (81.5 kg)   SpO2 94%   BMI 28.13 kg/m   BP Readings from Last 3 Encounters:  04/06/22 130/71  11/19/21 128/71  09/16/21 110/68    Wt Readings from Last 3 Encounters:  04/06/22 179 lb 9.6 oz (81.5 kg)  12/02/21 180 lb (81.6 kg)  11/19/21 180 lb 12.8 oz (82 kg)     Physical Exam Vitals reviewed.  Constitutional:      Appearance: He is well-developed.  HENT:     Head: Normocephalic and atraumatic.     Right Ear: External ear normal.     Left Ear: External ear normal.      Mouth/Throat:     Pharynx: No oropharyngeal exudate or posterior oropharyngeal erythema.  Eyes:     Pupils: Pupils are equal, round, and reactive to light.  Cardiovascular:     Rate and Rhythm: Normal rate and regular rhythm.     Pulses: Normal pulses.     Heart sounds: No murmur heard.    Comments: Ropy varicosities RLE at anterior medial shin  Pulmonary:     Effort: No respiratory distress.     Breath sounds: Normal breath sounds.  Musculoskeletal:     Cervical back: Normal range of motion and neck supple.     Right lower leg: No edema.     Left lower leg: No edema.  Neurological:     Mental Status: He is alert and oriented to person, place, and time.       Assessment & Plan:   Aaron Whitney was seen today for medical management of chronic issues.  Diagnoses and all orders for this visit:  Essential hypertension -     CBC with Differential/Platelet -     CMP14+EGFR  Mixed hyperlipidemia -     Lipid panel  Benign prostatic hyperplasia with nocturia -     tamsulosin (FLOMAX) 0.4 MG CAPS capsule; Take  2 capsules (0.8 mg total) by mouth daily.  Palpitations -     EKG 12-Lead  Varicose veins of lower limb without ulcer or inflammation, right -     Ambulatory referral to Vascular Surgery       I have changed Aaron Poot. Huwe "[name]"'s tamsulosin. I am also having him maintain his Osteo Bi-Flex Triple Strength, amLODipine, rosuvastatin, and carvedilol.  Allergies as of 04/06/2022   No Known Allergies      Medication List        Accurate as of April 06, 2022 12:11 PM. If you have any questions, ask your nurse or doctor.          amLODipine 10 MG tablet Commonly known as: NORVASC Take 1 tablet (10 mg total) by mouth daily.   carvedilol 6.25 MG tablet Commonly known as: COREG TAKE ONE TABLET BY MOUTH TWICE A DAY WITH MEALS   Osteo Bi-Flex Triple Strength Tabs Take 1 tablet by mouth 2 (two) times daily.   rosuvastatin 5 MG tablet Commonly  known as: Crestor Take 1 tablet (5 mg total) by mouth daily. For cholesterol   tamsulosin 0.4 MG Caps capsule Commonly known as: FLOMAX Take 2 capsules (0.8 mg total) by mouth daily.         Follow-up: Return in about 1 month (around 05/07/2022), or if symptoms worsen or fail to improve.  Claretta Fraise, M.D.

## 2022-04-28 ENCOUNTER — Other Ambulatory Visit: Payer: Self-pay | Admitting: *Deleted

## 2022-04-28 DIAGNOSIS — M79604 Pain in right leg: Secondary | ICD-10-CM

## 2022-05-04 ENCOUNTER — Encounter: Payer: Self-pay | Admitting: Family Medicine

## 2022-05-04 DIAGNOSIS — I10 Essential (primary) hypertension: Secondary | ICD-10-CM

## 2022-05-06 MED ORDER — CARVEDILOL 6.25 MG PO TABS: ORAL_TABLET | ORAL | 3 refills | Status: DC

## 2022-05-11 ENCOUNTER — Ambulatory Visit: Payer: Medicare Other | Admitting: Family Medicine

## 2022-05-12 ENCOUNTER — Ambulatory Visit (HOSPITAL_COMMUNITY)
Admission: RE | Admit: 2022-05-12 | Discharge: 2022-05-12 | Disposition: A | Discharge status: Home / Self Care | Payer: Medicare Other | Source: Ambulatory Visit | Attending: Vascular Surgery | Admitting: Vascular Surgery

## 2022-05-12 ENCOUNTER — Encounter: Payer: Self-pay | Admitting: Vascular Surgery

## 2022-05-12 ENCOUNTER — Ambulatory Visit: Payer: Medicare Other | Admitting: Vascular Surgery

## 2022-05-12 VITALS — BP 139/81 | HR 69 | Temp 97.7°F | Resp 18 | Ht 67.0 in | Wt 180.0 lb

## 2022-05-12 DIAGNOSIS — I872 Venous insufficiency (chronic) (peripheral): Secondary | ICD-10-CM | POA: Diagnosis not present

## 2022-05-12 DIAGNOSIS — M79604 Pain in right leg: Secondary | ICD-10-CM | POA: Insufficient documentation

## 2022-05-12 NOTE — Progress Notes (Signed)
Patient name: Aaron Whitney MRN: [id number] DOB: 18-Sep-1935 Sex: male  REASON FOR CONSULT: Right leg varicose veins  HPI: Aaron Whitney is a 86 y.o. male, with history of hypertension that presents for evaluation of right leg varicose veins.  Patient has noted multiple prominent varicosities in his right leg.  These have gotten larger recently.  He actually had his right great saphenous vein stripped about 50 years ago.  He has been wearing knee-high compression stockings.  He has no pain associated with the varicosities and has just noticed the prominence.  No history of DVT.  Past Medical History:  Diagnosis Date   Cancer ([place]) 2013   prostate   Hypertension    Macular degeneration     Past Surgical History:  Procedure Laterality Date   PROSTATE SURGERY      Family History  Problem Relation Age of Onset   Cancer Mother    Alcohol abuse Father     SOCIAL HISTORY: Social History   Socioeconomic History   Marital status: Married    Spouse name: Aaron Whitney   Number of children: 4   Years of education: Not on file   Highest education level: Not on file  Occupational History   Occupation: retired  Tobacco Use   Smoking status: Never   Smokeless tobacco: Never  Substance and Sexual Activity   Alcohol use: No    Alcohol/week: 0.0 standard drinks of alcohol   Drug use: No   Sexual activity: Not on file  Other Topics Concern   Not on file  Social History Narrative   Lives at home with wife. Family lives nearby.    Stays active, exercising 3-4 times per week. Actively involved in church.   Hx of prostate cancer - seeing Dr Aaron Whitney for routine f/u   Social Determinants of Health   Financial Resource Strain: Low Risk  (12/02/2021)   Overall Financial Resource Strain (CARDIA)    Difficulty of Paying Living Expenses: Not hard at all  Food Insecurity: No Food Insecurity (12/02/2021)   Hunger Vital Sign    Worried About Running Out of Food in the Last Year:  Never true    Ran Out of Food in the Last Year: Never true  Transportation Needs: No Transportation Needs (12/02/2021)   PRAPARE - Hydrologist (Medical): No    Lack of Transportation (Non-Medical): No  Physical Activity: Sufficiently Active (12/02/2021)   Exercise Vital Sign    Days of Exercise per Week: 3 days    Minutes of Exercise per Session: 60 min  Stress: No Stress Concern Present (12/02/2021)   [place]    Feeling of Stress : Not at all  Social Connections: Central (12/02/2021)   Social Connection and Isolation Panel [NHANES]    Frequency of Communication with Friends and Family: More than three times a week    Frequency of Social Gatherings with Friends and Family: More than three times a week    Attends Religious Services: More than 4 times per year    Active Member of [employer] or Organizations: Yes    Attends Archivist Meetings: More than 4 times per year    Marital Status: Married  Human resources officer Violence: Not At Risk (12/02/2021)   Humiliation, Afraid, Rape, and Kick questionnaire    Fear of Current or Ex-Partner: No    Emotionally Abused: No    Physically Abused: No  Sexually Abused: No    No Known Allergies  Current Outpatient Medications  Medication Sig Dispense Refill   amLODipine (NORVASC) 10 MG tablet Take 1 tablet (10 mg total) by mouth daily. 90 tablet 3   carvedilol (COREG) 6.25 MG tablet Take 1 & 1/2 each morning and one each evening. 75 tablet 3   Misc Natural Products (OSTEO BI-FLEX TRIPLE STRENGTH) TABS Take 1 tablet by mouth 2 (two) times daily. 1 tablet 1   tamsulosin (FLOMAX) 0.4 MG CAPS capsule Take 2 capsules (0.8 mg total) by mouth daily. 180 capsule 3   rosuvastatin (CRESTOR) 5 MG tablet Take 1 tablet (5 mg total) by mouth daily. For cholesterol (Patient not taking: Reported on 04/06/2022) 90 tablet 3   No current  facility-administered medications for this visit.    REVIEW OF SYSTEMS:  '[X]'$  denotes positive finding, '[ ]'$  denotes negative finding Cardiac  Comments:  Chest pain or chest pressure:    Shortness of breath upon exertion:    Short of breath when lying flat:    Irregular heart rhythm:        Vascular    Pain in calf, thigh, or hip brought on by ambulation:    Pain in feet at night that wakes you up from your sleep:     Blood clot in your veins:    Leg swelling:         Pulmonary    Oxygen at home:    Productive cough:     Wheezing:         Neurologic    Sudden weakness in arms or legs:     Sudden numbness in arms or legs:     Sudden onset of difficulty speaking or slurred speech:    Temporary loss of vision in one eye:     Problems with dizziness:         Gastrointestinal    Blood in stool:     Vomited blood:         Genitourinary    Burning when urinating:     Blood in urine:        Psychiatric    Major depression:         Hematologic    Bleeding problems:    Problems with blood clotting too easily:        Skin    Rashes or ulcers:        Constitutional    Fever or chills:      PHYSICAL EXAM: Vitals:   05/12/22 1333  BP: 139/81  Pulse: 69  Resp: 18  Temp: 97.7 F (36.5 C)  TempSrc: Temporal  SpO2: 95%  Weight: 180 lb (81.6 kg)  Height: '5\' 7"'$  (1.702 m)    GENERAL: The patient is a well-nourished male, in no acute distress. The vital signs are documented above. CARDIAC: There is a regular rate and rhythm.  VASCULAR:  Palpable femoral pulses bilaterally Palpable PT pulses bilaterally Right calf prominent varicosities PULMONARY: No respiratory distress. ABDOMEN: Soft and non-tender. MUSCULOSKELETAL: There are no major deformities or cyanosis. NEUROLOGIC: No focal weakness or paresthesias are detected. PSYCHIATRIC: The patient has a normal affect.  DATA:    Lower Venous Reflux Study   Patient Name:  Aaron [facility]  Date of Exam:    05/12/2022  Medical Rec #: [id number]              Accession #:    [id number]  Date of Birth: 01-24-1936  Patient Gender: M  Patient Age:   6 years  Exam Location:  [facility]  Procedure:      VAS [place] LOWER EXTREMITY VENOUS REFLUX  Referring Phys: Aaron Whitney    ---------------------------------------------------------------------------  -----    Indications: Varicose veins of the right leg.  Remote history of vein stripping.    Performing Technologist: Ronal Fear RVS, RCS     Examination Guidelines: A complete evaluation includes B-mode imaging,  spectral  Doppler, color Doppler, and power Doppler as needed of all accessible  portions  of each vessel. Bilateral testing is considered an integral part of a  complete  examination. Limited examinations for reoccurring indications may be  performed  as noted. The reflux portion of the exam is performed with the patient in  reverse Trendelenburg.  Significant venous reflux is defined as >500 ms in the superficial venous  system, and >1 second in the deep venous system.     Venous Reflux Times  +--------------+--------+------+----------+------------+-------------------  ----+  RIGHT        Reflux  Reflux  Reflux  Diameter cmsComments                                No       Yes     Time                                         +--------------+--------+------+----------+------------+-------------------  ----+  CFV                   yes  >1 second                                       +--------------+--------+------+----------+------------+-------------------  ----+  FV mid                 yes  >1 second                                       +--------------+--------+------+----------+------------+-------------------  ----+  Popliteal    no                                                             +--------------+--------+------+----------+------------+-------------------  ----+  GSV at [facility]                                        prior                                                                       ablation/stripping        +--------------+--------+------+----------+------------+-------------------  ----+  GSV prox thigh                                    prior                                                                       ablation/stripping        +--------------+--------+------+----------+------------+-------------------  ----+  GSV mid thigh                                     prior                                                                       ablation/stripping        +--------------+--------+------+----------+------------+-------------------  ----+  GSV dist thigh                                    prior                                                                       ablation/stripping        +--------------+--------+------+----------+------------+-------------------  ----+  GSV at knee                                       prior                                                                       ablation/stripping        +--------------+--------+------+----------+------------+-------------------  ----+  GSV prox calf                                     prior                                                                       ablation/stripping        +--------------+--------+------+----------+------------+-------------------  ----+  SSV Pop Fossa                                     prior                                                                       ablation/stripping        +--------------+--------+------+----------+------------+-------------------  ----+  SSV prox calf                                     prior                                                                        ablation/stripping        +--------------+--------+------+----------+------------+-------------------  ----+     Summary:  Right:  - No evidence of deep vein thrombosis from the common femoral through the  popliteal veins.  - No evidence of superficial venous thrombosis.  - The common femoral and mid femoral veins are not competent.  - The great saphenous and small saphenous veins are not observed and  consistent with stripping.  - Small tortuous varicosity observed in the groin, off the SFJ, appears to  course the medial thigh and associated with lower leg varicosities.    *See table(s) above for measurements and observations.   Electronically signed by Aaron Martinez MD on 05/12/2022 at 1:43:11 PM.   Assessment/Plan:  86 year old male presents for evaluation of prominent right leg varicosities in the setting of chronic venous insufficiency.  He had his right great saphenous vein stripped about 50 years ago and this was not visualized on duplex today.   I discussed the right great saphenous vein has already been stripped which is the normal target for laser ablation.  Discussed that his varicosities could be treated with stab phlebectomies in the office.  Ultimately he is not having any pain associated with these and has elected conservative management.  Will continue leg elevation with compression as well as exercise.  We did get him sized for medical grade 20-30 knee-high compression stockings today.  He can follow-up PRN.   Aaron Heck, MD [facility] Office: [phone]

## 2022-05-21 ENCOUNTER — Encounter: Payer: Medicare Other | Admitting: Family Medicine

## 2022-05-27 ENCOUNTER — Ambulatory Visit (INDEPENDENT_AMBULATORY_CARE_PROVIDER_SITE_OTHER): Payer: Medicare Other | Admitting: Family Medicine

## 2022-05-27 ENCOUNTER — Telehealth: Payer: Self-pay | Admitting: Family Medicine

## 2022-05-27 ENCOUNTER — Ambulatory Visit (INDEPENDENT_AMBULATORY_CARE_PROVIDER_SITE_OTHER): Payer: Medicare Other

## 2022-05-27 ENCOUNTER — Encounter: Payer: Self-pay | Admitting: Family Medicine

## 2022-05-27 VITALS — BP 117/63 | HR 68 | Temp 97.5°F | Ht 67.0 in | Wt 179.8 lb

## 2022-05-27 DIAGNOSIS — R002 Palpitations: Secondary | ICD-10-CM | POA: Diagnosis not present

## 2022-05-27 DIAGNOSIS — E782 Mixed hyperlipidemia: Secondary | ICD-10-CM | POA: Diagnosis not present

## 2022-05-27 DIAGNOSIS — Z0000 Encounter for general adult medical examination without abnormal findings: Secondary | ICD-10-CM

## 2022-05-27 DIAGNOSIS — R351 Nocturia: Secondary | ICD-10-CM

## 2022-05-27 DIAGNOSIS — M25562 Pain in left knee: Secondary | ICD-10-CM

## 2022-05-27 DIAGNOSIS — Z0001 Encounter for general adult medical examination with abnormal findings: Secondary | ICD-10-CM | POA: Diagnosis not present

## 2022-05-27 DIAGNOSIS — N401 Enlarged prostate with lower urinary tract symptoms: Secondary | ICD-10-CM

## 2022-05-27 DIAGNOSIS — M159 Polyosteoarthritis, unspecified: Secondary | ICD-10-CM

## 2022-05-27 DIAGNOSIS — I10 Essential (primary) hypertension: Secondary | ICD-10-CM | POA: Diagnosis not present

## 2022-05-27 DIAGNOSIS — C61 Malignant neoplasm of prostate: Secondary | ICD-10-CM

## 2022-05-27 LAB — URINALYSIS
Bilirubin, UA: NEGATIVE
Glucose, UA: NEGATIVE
Ketones, UA: NEGATIVE
Leukocytes,UA: NEGATIVE
Nitrite, UA: NEGATIVE
Protein,UA: NEGATIVE
RBC, UA: NEGATIVE
Specific Gravity, UA: 1.015 (ref 1.005–1.030)
Urobilinogen, Ur: 0.2 mg/dL (ref 0.2–1.0)
pH, UA: 7 (ref 5.0–7.5)

## 2022-05-27 MED ORDER — CARVEDILOL 12.5 MG PO TABS: 12.5000 mg | ORAL_TABLET | Freq: Two times a day (BID) | ORAL | 3 refills | Status: DC

## 2022-05-27 MED ORDER — FLUTICASONE PROPIONATE 50 MCG/ACT NA SUSP: 2.0000 | Freq: Every day | NASAL | 6 refills | Status: AC

## 2022-05-27 MED ORDER — ALFUZOSIN HCL ER 10 MG PO TB24: 10.0000 mg | ORAL_TABLET | Freq: Every day | ORAL | 1 refills | Status: DC

## 2022-05-27 NOTE — Progress Notes (Signed)
Subjective:  Patient ID: Aaron Whitney, male    DOB: 23-Jul-1935  Age: 86 y.o. MRN: [id number]  CC: Annual Exam   HPI Aaron Whitney presents for Annual physical.    presents for  follow-up of hypertension. Patient has no history of headache chest pain or shortness of breath or recent cough. Patient also denies symptoms of TIA such as focal numbness or weakness. Patient denies side effects from medication. States taking it regularly.  Prefers no med for cholesterol elevation. Having nocturia in spite of tamsulosin, 4 times a night most nights.         05/27/2022   11:10 AM 04/06/2022   11:31 AM 12/02/2021    2:53 PM  Depression screen PHQ 2/9  Decreased Interest 0 0 0  Down, Depressed, Hopeless 0 0 0  PHQ - 2 Score 0 0 0    History Aaron Whitney has a past medical history of Cancer ([place]) (2013), Hypertension, and Macular degeneration.   Aaron Whitney has a past surgical history that includes Prostate surgery.   His family history includes Alcohol abuse in his father; Cancer in his mother.Aaron Whitney reports that Aaron Whitney has never smoked. Aaron Whitney has never used smokeless tobacco. Aaron Whitney reports that Aaron Whitney does not drink alcohol and does not use drugs.    ROS Review of Systems  Constitutional:  Negative for activity change, fatigue and unexpected weight change.  HENT:  Negative for congestion, ear pain, hearing loss, postnasal drip and trouble swallowing.   Eyes:  Negative for pain and visual disturbance.  Respiratory:  Negative for cough, chest tightness and shortness of breath.   Cardiovascular:  Positive for palpitations (near constant). Negative for chest pain and leg swelling.  Gastrointestinal:  Negative for abdominal distention, abdominal pain, blood in stool, constipation, diarrhea, nausea and vomiting.  Endocrine: Negative for cold intolerance, heat intolerance and polydipsia.  Genitourinary:  Positive for frequency. Negative for difficulty urinating, dysuria, flank pain and urgency.   Musculoskeletal:  Negative for arthralgias and joint swelling.  Skin:  Negative for color change, rash and wound.  Neurological:  Negative for dizziness, syncope, speech difficulty, weakness, light-headedness, numbness and headaches.  Hematological:  Does not bruise/bleed easily.  Psychiatric/Behavioral:  Negative for confusion, decreased concentration, dysphoric mood and sleep disturbance. The patient is not nervous/anxious.     Objective:  BP 117/63   Pulse 68   Temp (!) 97.5 F (36.4 C)   Ht _0  (1.702 m)   Wt 179 lb 12.8 oz (81.6 kg)   SpO2 94%   BMI 28.16 kg/m   BP Readings from Last 3 Encounters:  05/27/22 117/63  05/12/22 139/81  04/06/22 130/71    Wt Readings from Last 3 Encounters:  05/27/22 179 lb 12.8 oz (81.6 kg)  05/12/22 180 lb (81.6 kg)  04/06/22 179 lb 9.6 oz (81.5 kg)     Physical Exam Constitutional:      Appearance: Aaron Whitney is well-developed.  HENT:     Head: Normocephalic and atraumatic.     Nose: Congestion (left sided edema) present.  Eyes:     Pupils: Pupils are equal, round, and reactive to light.  Neck:     Thyroid: No thyromegaly.     Trachea: No tracheal deviation.  Cardiovascular:     Rate and Rhythm: Normal rate and regular rhythm.     Heart sounds: Normal heart sounds. No murmur heard.    No friction rub. No gallop.  Pulmonary:     Breath sounds: Normal breath sounds. No wheezing  or rales.  Abdominal:     General: Bowel sounds are normal. There is no distension.     Palpations: Abdomen is soft. There is no mass.     Tenderness: There is no abdominal tenderness.     Hernia: There is no hernia in the left inguinal area.  Genitourinary:    Penis: Normal.      Testes: Normal.  Musculoskeletal:        General: Normal range of motion.     Cervical back: Normal range of motion.  Lymphadenopathy:     Cervical: No cervical adenopathy.  Skin:    General: Skin is warm and dry.  Neurological:     Mental Status: Aaron Whitney is alert and oriented  to person, place, and time.       Assessment & Plan:   Aaron Whitney was seen today for annual exam.  Diagnoses and all orders for this visit:  Encounter for wellness examination in adult -     Urinalysis  Mixed hyperlipidemia -     Lipid panel  Essential hypertension -     CBC with Differential/Platelet -     CMP14+EGFR -     Discontinue: carvedilol (COREG) 12.5 MG tablet; Take 1 tablet (12.5 mg total) by mouth 2 (two) times daily with a meal. Take 1 & 1/2 each morning and one each evening. -     carvedilol (COREG) 12.5 MG tablet; Take 1 tablet (12.5 mg total) by mouth 2 (two) times daily with a meal.  Primary osteoarthritis involving multiple joints -     DG Knee 1-2 Views Left; Future  Palpitations -     EKG 12-Lead  Benign prostatic hyperplasia with nocturia -     PSA, total and free  CA of prostate (HCC) -     PSA, total and free  Other orders -     fluticasone (FLONASE) 50 MCG/ACT nasal spray; Place 2 sprays into both nostrils daily. -     alfuzosin (UROXATRAL) 10 MG 24 hr tablet; Take 1 tablet (10 mg total) by mouth daily with breakfast.       I have discontinued Aaron Whitney. Aaron Whitney rosuvastatin, tamsulosin, and carvedilol. I have also changed his carvedilol. Additionally, I am having him start on fluticasone and alfuzosin. Lastly, I am having him maintain his Osteo Bi-Flex Triple Strength and amLODipine.  Allergies as of 05/27/2022   No Known Allergies      Medication List        Accurate as of May 27, 2022 10:04 PM. If you have any questions, ask your nurse or doctor.          STOP taking these medications    rosuvastatin 5 MG tablet Commonly known as: Crestor Stopped by: Claretta Fraise, MD   tamsulosin 0.4 MG Caps capsule Commonly known as: FLOMAX Stopped by: Claretta Fraise, MD       TAKE these medications    alfuzosin 10 MG 24 hr tablet Commonly known as: UROXATRAL Take 1 tablet (10 mg total) by mouth daily with  breakfast. Started by: Claretta Fraise, MD   amLODipine 10 MG tablet Commonly known as: NORVASC Take 1 tablet (10 mg total) by mouth daily.   carvedilol 12.5 MG tablet Commonly known as: COREG Take 1 tablet (12.5 mg total) by mouth 2 (two) times daily with a meal. What changed:  medication strength how much to take how to take this when to take this additional instructions Changed by: Claretta Fraise, MD   fluticasone  50 MCG/ACT nasal spray Commonly known as: FLONASE Place 2 sprays into both nostrils daily. Started by: Claretta Fraise, MD   Osteo Bi-Flex Triple Strength Tabs Take 1 tablet by mouth 2 (two) times daily.         Follow-up: Return in about 3 months (around 08/26/2022).  Claretta Fraise, M.D.

## 2022-05-28 LAB — CMP14+EGFR
ALT: 16 IU/L (ref 0–44)
AST: 19 IU/L (ref 0–40)
Albumin/Globulin Ratio: 1.8 (ref 1.2–2.2)
Albumin: 4.5 g/dL (ref 3.7–4.7)
Alkaline Phosphatase: 67 IU/L (ref 44–121)
BUN/Creatinine Ratio: 23 (ref 10–24)
BUN: 19 mg/dL (ref 8–27)
Bilirubin Total: 0.8 mg/dL (ref 0.0–1.2)
CO2: 24 mmol/L (ref 20–29)
Calcium: 9.8 mg/dL (ref 8.6–10.2)
Chloride: 98 mmol/L (ref 96–106)
Creatinine, Ser: 0.84 mg/dL (ref 0.76–1.27)
Globulin, Total: 2.5 g/dL (ref 1.5–4.5)
Glucose: 121 mg/dL — ABNORMAL HIGH (ref 70–99)
Potassium: 4.5 mmol/L (ref 3.5–5.2)
Sodium: 136 mmol/L (ref 134–144)
Total Protein: 7 g/dL (ref 6.0–8.5)
eGFR: 86 mL/min/{1.73_m2} (ref >59)

## 2022-05-28 LAB — LIPID PANEL
Chol/HDL Ratio: 4.5 ratio (ref 0.0–5.0)
Cholesterol, Total: 236 mg/dL — ABNORMAL HIGH (ref 100–199)
HDL: 53 mg/dL (ref >39)
LDL Chol Calc (NIH): 168 mg/dL — ABNORMAL HIGH (ref 0–99)
Triglycerides: 86 mg/dL (ref 0–149)
VLDL Cholesterol Cal: 15 mg/dL (ref 5–40)

## 2022-05-28 LAB — CBC WITH DIFFERENTIAL/PLATELET
Basophils Absolute: 0.1 10*3/uL (ref 0.0–0.2)
Basos: 1 %
EOS (ABSOLUTE): 0.3 10*3/uL (ref 0.0–0.4)
Eos: 3 %
Hematocrit: 45.4 % (ref 37.5–51.0)
Hemoglobin: 15.3 g/dL (ref 13.0–17.7)
Immature Grans (Abs): 0 10*3/uL (ref 0.0–0.1)
Immature Granulocytes: 0 %
Lymphocytes Absolute: 2.2 10*3/uL (ref 0.7–3.1)
Lymphs: 25 %
MCH: 30.7 pg (ref 26.6–33.0)
MCHC: 33.7 g/dL (ref 31.5–35.7)
MCV: 91 fL (ref 79–97)
Monocytes Absolute: 0.8 10*3/uL (ref 0.1–0.9)
Monocytes: 10 %
Neutrophils Absolute: 5.4 10*3/uL (ref 1.4–7.0)
Neutrophils: 61 %
Platelets: 220 10*3/uL (ref 150–450)
RBC: 4.99 x10E6/uL (ref 4.14–5.80)
RDW: 13.3 % (ref 11.6–15.4)
WBC: 8.8 10*3/uL (ref 3.4–10.8)

## 2022-05-28 LAB — PSA, TOTAL AND FREE
PSA, Free Pct: 6.7 %
PSA, Free: 0.16 ng/mL
Prostate Specific Ag, Serum: 2.4 ng/mL (ref 0.0–4.0)

## 2022-06-09 ENCOUNTER — Other Ambulatory Visit: Payer: Self-pay | Admitting: Family Medicine

## 2022-06-09 DIAGNOSIS — I10 Essential (primary) hypertension: Secondary | ICD-10-CM

## 2022-07-09 ENCOUNTER — Encounter: Payer: Self-pay | Admitting: Family Medicine

## 2022-07-09 ENCOUNTER — Other Ambulatory Visit: Payer: Self-pay | Admitting: Family Medicine

## 2022-07-09 DIAGNOSIS — N401 Enlarged prostate with lower urinary tract symptoms: Secondary | ICD-10-CM

## 2022-07-09 MED ORDER — TAMSULOSIN HCL 0.4 MG PO CAPS: 0.8000 mg | ORAL_CAPSULE | Freq: Every day | ORAL | 3 refills | Status: DC

## 2022-08-26 ENCOUNTER — Ambulatory Visit (INDEPENDENT_AMBULATORY_CARE_PROVIDER_SITE_OTHER): Payer: Medicare Other | Admitting: Family Medicine

## 2022-08-26 ENCOUNTER — Encounter: Payer: Self-pay | Admitting: Family Medicine

## 2022-08-26 VITALS — BP 120/67 | HR 69 | Temp 97.0°F | Ht 67.0 in | Wt 176.6 lb

## 2022-08-26 DIAGNOSIS — N401 Enlarged prostate with lower urinary tract symptoms: Secondary | ICD-10-CM

## 2022-08-26 DIAGNOSIS — E782 Mixed hyperlipidemia: Secondary | ICD-10-CM | POA: Diagnosis not present

## 2022-08-26 DIAGNOSIS — R002 Palpitations: Secondary | ICD-10-CM | POA: Diagnosis not present

## 2022-08-26 DIAGNOSIS — I10 Essential (primary) hypertension: Secondary | ICD-10-CM | POA: Diagnosis not present

## 2022-08-26 NOTE — Progress Notes (Signed)
Subjective:  Patient ID: Aaron Whitney, male    DOB: March 03, 1936  Age: 87 y.o. MRN: [id number]  CC: Medical Management of Chronic Issues   HPI Aaron Whitney presents for  presents for  follow-up of hypertension. Patient has no history of headache chest pain or shortness of breath or recent cough. Patient also denies symptoms of TIA such as focal numbness or weakness. Patient denies side effects from medication. States taking it regularly. Heart rate drops in the evenings to 58-60. Makes him a little too tired.       08/26/2022    9:45 AM 05/27/2022   11:10 AM 04/06/2022   11:31 AM  Depression screen PHQ 2/9  Decreased Interest 0 0 0  Down, Depressed, Hopeless 0 0 0  PHQ - 2 Score 0 0 0    History Aaron Whitney has a past medical history of Cancer (Aaron Whitney) (2013), Hypertension, and Macular degeneration.   He has a past surgical history that includes Prostate surgery.   His family history includes Alcohol abuse in his father; Cancer in his mother.He reports that he has never smoked. He has never used smokeless tobacco. He reports that he does not drink alcohol and does not use drugs.    ROS Review of Systems  Objective:  BP 120/67   Pulse 69   Temp (!) 97 F (36.1 C)   Ht '5\' 7"'$  (1.702 m)   Wt 176 lb 9.6 oz (80.1 kg)   SpO2 92%   BMI 27.66 kg/m   BP Readings from Last 3 Encounters:  08/26/22 120/67  05/27/22 117/63  05/12/22 139/81    Wt Readings from Last 3 Encounters:  08/26/22 176 lb 9.6 oz (80.1 kg)  05/27/22 179 lb 12.8 oz (81.6 kg)  05/12/22 180 lb (81.6 kg)     Physical Exam Vitals reviewed.  Constitutional:      Appearance: He is well-developed.  HENT:     Head: Normocephalic and atraumatic.     Right Ear: External ear normal.     Left Ear: External ear normal.     Mouth/Throat:     Pharynx: No oropharyngeal exudate or posterior oropharyngeal erythema.  Eyes:     Pupils: Pupils are equal, round, and reactive to light.  Cardiovascular:      Rate and Rhythm: Normal rate. Rhythm irregular. Frequent Extrasystoles are present.    Chest Wall: PMI is not displaced.     Pulses: Normal pulses.     Heart sounds: No murmur heard. Pulmonary:     Effort: No respiratory distress.     Breath sounds: Normal breath sounds.  Musculoskeletal:     Cervical back: Normal range of motion and neck supple.  Neurological:     Mental Status: He is alert and oriented to person, place, and time.       Assessment & Plan:   Aaron Whitney was seen today for medical management of chronic issues.  Diagnoses and all orders for this visit:  Mixed hyperlipidemia -     Lipid panel  Essential hypertension -     CMP14+EGFR  Benign prostatic hyperplasia with nocturia  Palpitations -     EKG 12-Lead       I am having Aaron Whitney. [name] "Aaron Whitney" maintain his Osteo Bi-Flex Triple Strength, amLODipine, fluticasone, carvedilol, and tamsulosin.  Allergies as of 08/26/2022   No Known Allergies      Medication List        Accurate as of August 26, 2022 10:44 AM.  If you have any questions, ask your nurse or doctor.          amLODipine 10 MG tablet Commonly known as: NORVASC Take 1 tablet (10 mg total) by mouth daily.   carvedilol 12.5 MG tablet Commonly known as: COREG Take 1 tablet (12.5 mg total) by mouth 2 (two) times daily with a meal.   fluticasone 50 MCG/ACT nasal spray Commonly known as: FLONASE Place 2 sprays into both nostrils daily.   Osteo Bi-Flex Triple Strength Tabs Take 1 tablet by mouth 2 (two) times daily.   tamsulosin 0.4 MG Caps capsule Commonly known as: FLOMAX Take 2 capsules (0.8 mg total) by mouth daily.         Follow-up: Return in about 3 months (around 11/26/2022) for and set up time to see cardiology, Dr. Etter Sjogren.  Claretta Fraise, M.D.

## 2022-11-14 ENCOUNTER — Other Ambulatory Visit: Payer: Self-pay | Admitting: Family Medicine

## 2022-11-30 ENCOUNTER — Ambulatory Visit: Payer: Medicare Other | Admitting: Family Medicine

## 2022-12-03 ENCOUNTER — Encounter: Payer: Self-pay | Admitting: Family Medicine

## 2022-12-03 ENCOUNTER — Ambulatory Visit (INDEPENDENT_AMBULATORY_CARE_PROVIDER_SITE_OTHER): Payer: Medicare Other | Admitting: Family Medicine

## 2022-12-03 VITALS — BP 135/70 | HR 63 | Temp 97.6°F | Ht 67.0 in | Wt 173.2 lb

## 2022-12-03 DIAGNOSIS — I10 Essential (primary) hypertension: Secondary | ICD-10-CM

## 2022-12-03 DIAGNOSIS — E782 Mixed hyperlipidemia: Secondary | ICD-10-CM | POA: Diagnosis not present

## 2022-12-03 MED ORDER — AMLODIPINE BESYLATE 10 MG PO TABS: 10.0000 mg | ORAL_TABLET | Freq: Every day | ORAL | 2 refills | Status: DC

## 2022-12-03 NOTE — Progress Notes (Signed)
Subjective:  Patient ID: Aaron Whitney, male    DOB: [birth year]  Age: 87 y.o. MRN: [id number]  CC: Medical Management of Chronic Issues   HPI Aaron Whitney presents for quick beats occurring several times a day. BP running real good 120s /70/s. Not accompanied by faintness, dizziness, chest pain or dyspnea.  Tamsulosin skipped for a day or two leads to a lot more nocturia. Resumed.      12/03/2022    4:02 PM 08/26/2022    9:45 AM 05/27/2022   11:10 AM  Depression screen PHQ 2/9  Decreased Interest 0 0 0  Down, Depressed, Hopeless 0 0 0  PHQ - 2 Score 0 0 0    History Aaron Whitney has a past medical history of Cancer (HCC) (2013), Hypertension, and Macular degeneration.   He has a past surgical history that includes Prostate surgery.   His family history includes Alcohol abuse in his father; Cancer in his mother.He reports that he has never smoked. He has never used smokeless tobacco. He reports that he does not drink alcohol and does not use drugs.    ROS Review of Systems  Constitutional:  Negative for fever.  Respiratory:  Negative for shortness of breath.   Cardiovascular:  Negative for chest pain.  Musculoskeletal:  Negative for arthralgias.  Skin:  Negative for rash.    Objective:  BP 135/70   Pulse 63   Temp 97.6 F (36.4 C) (Temporal)   Ht 5\' 7"  (1.702 m)   Wt 173 lb 3.2 oz (78.6 kg)   SpO2 95%   BMI 27.13 kg/m   BP Readings from Last 3 Encounters:  12/03/22 135/70  08/26/22 120/67  05/27/22 117/63    Wt Readings from Last 3 Encounters:  12/03/22 173 lb 3.2 oz (78.6 kg)  08/26/22 176 lb 9.6 oz (80.1 kg)  05/27/22 179 lb 12.8 oz (81.6 kg)     Physical Exam Vitals reviewed.  Constitutional:      Appearance: He is well-developed.  HENT:     Head: Normocephalic and atraumatic.     Right Ear: External ear normal.     Left Ear: External ear normal.     Mouth/Throat:     Pharynx: No oropharyngeal exudate or posterior oropharyngeal  erythema.  Eyes:     Pupils: Pupils are equal, round, and reactive to light.  Cardiovascular:     Rate and Rhythm: Normal rate and regular rhythm.     Heart sounds: No murmur heard. Pulmonary:     Effort: No respiratory distress.     Breath sounds: Normal breath sounds.  Musculoskeletal:     Cervical back: Normal range of motion and neck supple.  Neurological:     Mental Status: He is alert and oriented to person, place, and time.       Assessment & Plan:   Aaron Whitney was seen today for medical management of chronic issues.  Diagnoses and all orders for this visit:  Mixed hyperlipidemia -     Lipid panel  Essential hypertension -     CBC with Differential/Platelet -     CMP14+EGFR  Other orders -     amLODipine (NORVASC) 10 MG tablet; Take 1 tablet (10 mg total) by mouth daily.       I have changed [name]'s amLODipine. I am also having him maintain his Osteo Bi-Flex Triple Strength, fluticasone, carvedilol, and tamsulosin.  Allergies as of 12/03/2022   No Known Allergies  Medication List        Accurate as of December 03, 2022 11:59 PM. If you have any questions, ask your nurse or doctor.          amLODipine 10 MG tablet Commonly known as: NORVASC Take 1 tablet (10 mg total) by mouth daily.   carvedilol 12.5 MG tablet Commonly known as: COREG Take 1 tablet (12.5 mg total) by mouth 2 (two) times daily with a meal.   fluticasone 50 MCG/ACT nasal spray Commonly known as: FLONASE Place 2 sprays into both nostrils daily.   Osteo Bi-Flex Triple Strength Tabs Take 1 tablet by mouth 2 (two) times daily.   tamsulosin 0.4 MG Caps capsule Commonly known as: FLOMAX Take 2 capsules (0.8 mg total) by mouth daily.         Follow-up: Return in about 3 months (around 03/05/2023).  Mechele Claude, M.D.

## 2022-12-04 ENCOUNTER — Encounter: Payer: Self-pay | Admitting: Family Medicine

## 2022-12-04 LAB — CBC WITH DIFFERENTIAL/PLATELET
Basophils Absolute: 0.1 10*3/uL (ref 0.0–0.2)
Basos: 1 %
EOS (ABSOLUTE): 0.3 10*3/uL (ref 0.0–0.4)
Eos: 3 %
Hematocrit: 43.1 % (ref 37.5–51.0)
Hemoglobin: 15 g/dL (ref 13.0–17.7)
Immature Grans (Abs): 0 10*3/uL (ref 0.0–0.1)
Immature Granulocytes: 1 %
Lymphocytes Absolute: 2.2 10*3/uL (ref 0.7–3.1)
Lymphs: 27 %
MCH: 30.9 pg (ref 26.6–33.0)
MCHC: 34.8 g/dL (ref 31.5–35.7)
MCV: 89 fL (ref 79–97)
Monocytes Absolute: 0.9 10*3/uL (ref 0.1–0.9)
Monocytes: 10 %
Neutrophils Absolute: 4.8 10*3/uL (ref 1.4–7.0)
Neutrophils: 58 %
Platelets: 244 10*3/uL (ref 150–450)
RBC: 4.85 x10E6/uL (ref 4.14–5.80)
RDW: 13.3 % (ref 11.6–15.4)
WBC: 8.2 10*3/uL (ref 3.4–10.8)

## 2022-12-04 LAB — CMP14+EGFR
ALT: 15 IU/L (ref 0–44)
AST: 19 IU/L (ref 0–40)
Albumin: 4.5 g/dL (ref 3.7–4.7)
Alkaline Phosphatase: 63 IU/L (ref 44–121)
BUN/Creatinine Ratio: 21 (ref 10–24)
BUN: 16 mg/dL (ref 8–27)
Bilirubin Total: 0.6 mg/dL (ref 0.0–1.2)
CO2: 20 mmol/L (ref 20–29)
Calcium: 9.6 mg/dL (ref 8.6–10.2)
Chloride: 99 mmol/L (ref 96–106)
Creatinine, Ser: 0.76 mg/dL (ref 0.76–1.27)
Globulin, Total: 2.6 g/dL (ref 1.5–4.5)
Glucose: 106 mg/dL — ABNORMAL HIGH (ref 70–99)
Potassium: 4.4 mmol/L (ref 3.5–5.2)
Sodium: 134 mmol/L (ref 134–144)
Total Protein: 7.1 g/dL (ref 6.0–8.5)
eGFR: 88 mL/min/{1.73_m2} (ref >59)

## 2022-12-04 LAB — LIPID PANEL
Chol/HDL Ratio: 4.2 ratio (ref 0.0–5.0)
Cholesterol, Total: 209 mg/dL — ABNORMAL HIGH (ref 100–199)
HDL: 50 mg/dL (ref >39)
LDL Chol Calc (NIH): 141 mg/dL — ABNORMAL HIGH (ref 0–99)
Triglycerides: 100 mg/dL (ref 0–149)
VLDL Cholesterol Cal: 18 mg/dL (ref 5–40)

## 2022-12-06 ENCOUNTER — Encounter: Payer: Self-pay | Admitting: Family Medicine

## 2022-12-09 ENCOUNTER — Ambulatory Visit (INDEPENDENT_AMBULATORY_CARE_PROVIDER_SITE_OTHER): Payer: Medicare Other

## 2022-12-09 VITALS — Ht 67.0 in | Wt 173.0 lb

## 2022-12-09 DIAGNOSIS — Z0000 Encounter for general adult medical examination without abnormal findings: Secondary | ICD-10-CM | POA: Diagnosis not present

## 2022-12-09 NOTE — Progress Notes (Signed)
Subjective:   Aaron Whitney is a 87 y.o. male who presents for Medicare Annual/Subsequent preventive examination.  Visit Complete: Virtual  I connected with  Birdena Crandall on 12/09/22 by a audio enabled telemedicine application and verified that I am speaking with the correct person using two identifiers.  Patient Location: Home  Provider Location: Home Office  I discussed the limitations of evaluation and management by telemedicine. The patient expressed understanding and agreed to proceed.  Patient Medicare AWV questionnaire was completed by the patient on 12/09/2022; I have confirmed that all information answered by patient is correct and no changes since this date.  Review of Systems     Cardiac Risk Factors include: advanced age (>63men, >50 women);male gender;hypertension;dyslipidemia     Objective:    Today's Vitals   12/09/22 1412  Weight: 173 lb (78.5 kg)  Height: 5\' 7"  (1.702 m)   Body mass index is 27.1 kg/m.     12/09/2022    2:15 PM 12/02/2021    2:54 PM 11/21/2020    2:00 PM 11/21/2019    1:23 PM  Advanced Directives  Does Patient Have a Medical Advance Directive? Yes Yes Yes Yes  Type of Estate agent of [place];Living will Healthcare Power of [place];Living will Healthcare Power of [place];Living will Healthcare Power of [place];Living will  Does patient want to make changes to medical advance directive?    No - Patient declined  Copy of Healthcare Power of Attorney in Chart? No - copy requested No - copy requested No - copy requested   Would patient like information on creating a medical advance directive?    No - Patient declined    Current Medications (verified) Outpatient Encounter Medications as of 12/09/2022  Medication Sig   amLODipine (NORVASC) 10 MG tablet Take 1 tablet (10 mg total) by mouth daily.   carvedilol (COREG) 12.5 MG tablet Take 1 tablet (12.5 mg total) by mouth 2 (two) times daily with a meal.    fluticasone (FLONASE) 50 MCG/ACT nasal spray Place 2 sprays into both nostrils daily.   Misc Natural Products (OSTEO BI-FLEX TRIPLE STRENGTH) TABS Take 1 tablet by mouth 2 (two) times daily.   tamsulosin (FLOMAX) 0.4 MG CAPS capsule Take 2 capsules (0.8 mg total) by mouth daily.   No facility-administered encounter medications on file as of 12/09/2022.    Allergies (verified) Patient has no known allergies.   History: Past Medical History:  Diagnosis Date   Cancer (HCC) 2013   prostate   Hypertension    Macular degeneration    Past Surgical History:  Procedure Laterality Date   PROSTATE SURGERY     Family History  Problem Relation Age of Onset   Cancer Mother    Alcohol abuse Father    Social History   Socioeconomic History   Marital status: Married    Spouse name: Lucille Passy   Number of children: 4   Years of education: Not on file   Highest education level: Bachelor's degree (e.g., BA, AB, BS)  Occupational History   Occupation: retired  Tobacco Use   Smoking status: Never   Smokeless tobacco: Never  Substance and Sexual Activity   Alcohol use: No    Alcohol/week: 0.0 standard drinks of alcohol   Drug use: No   Sexual activity: Not on file  Other Topics Concern   Not on file  Social History Narrative   Lives at home with wife. Family lives nearby.    Stays active, exercising 3-4  times per week. Actively involved in church.   Hx of prostate cancer - seeing Dr Zachery Conch for routine f/u   Social Determinants of Health   Financial Resource Strain: Low Risk  (12/09/2022)   Overall Financial Resource Strain (CARDIA)    Difficulty of Paying Living Expenses: Not hard at all  Food Insecurity: No Food Insecurity (12/09/2022)   Hunger Vital Sign    Worried About Running Out of Food in the Last Year: Never true    Ran Out of Food in the Last Year: Never true  Transportation Needs: No Transportation Needs (12/09/2022)   PRAPARE - Administrator, Civil Service  (Medical): No    Lack of Transportation (Non-Medical): No  Physical Activity: Insufficiently Active (12/09/2022)   Exercise Vital Sign    Days of Exercise per Week: 3 days    Minutes of Exercise per Session: 30 min  Stress: No Stress Concern Present (12/09/2022)   [employer] of Occupational Health - Occupational Stress Questionnaire    Feeling of Stress : Not at all  Social Connections: Moderately Integrated (12/09/2022)   Social Connection and Isolation Panel [NHANES]    Frequency of Communication with Friends and Family: More than three times a week    Frequency of Social Gatherings with Friends and Family: More than three times a week    Attends Religious Services: More than 4 times per year    Active Member of [employer] or Organizations: No    Attends Banker Meetings: Never    Marital Status: Married  Recent Concern: Social Connections - Moderately Isolated (11/27/2022)   Social Connection and Isolation Panel [NHANES]    Frequency of Communication with Friends and Family: Once a week    Frequency of Social Gatherings with Friends and Family: Once a week    Attends Religious Services: More than 4 times per year    Active Member of [employer] or Organizations: No    Attends Engineer, structural: Not on file    Marital Status: Married    Tobacco Counseling Counseling given: Not Answered   Clinical Intake:  Pre-visit preparation completed: Yes  Pain : No/denies pain     Nutritional Risks: None Diabetes: No  How often do you need to have someone help you when you read instructions, pamphlets, or other written materials from your doctor or pharmacy?: 1 - Never  Interpreter Needed?: No  Information entered by :: Renie Ora, LPN   Activities of Daily Living    12/09/2022    2:16 PM 12/08/2022    8:39 PM  In your present state of health, do you have any difficulty performing the following activities:  Hearing? 0 0  Vision? 0 0  Difficulty  concentrating or making decisions? 0 0  Walking or climbing stairs? 0 0  Dressing or bathing? 0 0  Doing errands, shopping? 0 0  Preparing Food and eating ? N N  Using the Toilet? N N  In the past six months, have you accidently leaked urine? N N  Do you have problems with loss of bowel control? N N  Managing your Medications? N N  Managing your Finances? N N  Housekeeping or managing your Housekeeping? N N    Patient Care Team: Mechele Claude, MD as PCP - General (Family Medicine) Jake Bathe, MD as PCP - Cardiology (Cardiology) Melene Plan, MD as Referring Physician (Urology)  Indicate any recent Medical Services you may have received from other than [facility] providers  in the past year (date may be approximate).     Assessment:   This is a routine wellness examination for Gurshaan.  Hearing/Vision screen Vision Screening - Comments:: Wears rx glasses - up to date with routine eye exams with  Dr.Burnett   Dietary issues and exercise activities discussed:     Goals Addressed             This Visit's Progress    Exercise 3x per week (30 min per time)         Depression Screen    12/09/2022    2:15 PM 12/03/2022    4:02 PM 08/26/2022    9:45 AM 05/27/2022   11:10 AM 04/06/2022   11:31 AM 12/02/2021    2:53 PM 11/19/2021   10:32 AM  PHQ 2/9 Scores  PHQ - 2 Score 0 0 0 0 0 0 0    Fall Risk    12/09/2022    2:13 PM 12/08/2022    8:39 PM 12/03/2022    4:02 PM 08/26/2022    9:45 AM 05/27/2022   11:10 AM  Fall Risk   Falls in the past year? 0 0 0 0 0  Number falls in past yr: 0 0     Injury with Fall? 0 0     Risk for fall due to : No Fall Risks      Follow up Falls prevention discussed        MEDICARE RISK AT HOME:  Medicare Risk at Home - 12/09/22 1413     Any stairs in or around the home? No    If so, are there any without handrails? No    Home free of loose throw rugs in walkways, pet beds, electrical cords, etc? Yes    Adequate lighting in  your home to reduce risk of falls? Yes    Life alert? No    Use of a cane, walker or w/c? No    Grab bars in the bathroom? Yes    Shower chair or bench in shower? Yes    Elevated toilet seat or a handicapped toilet? Yes             TIMED UP AND GO:  Was the test performed?  No    Cognitive Function:        12/09/2022    2:16 PM 12/02/2021    2:55 PM 11/21/2020    1:38 PM 11/21/2019    1:27 PM  6CIT Screen  What Year? 0 points 0 points 0 points 0 points  What month? 0 points 0 points 0 points 0 points  What time? 0 points 0 points 0 points 0 points  Count back from 20 0 points 0 points 0 points 0 points  Months in reverse 0 points 0 points 0 points 0 points  Repeat phrase 0 points 6 points 0 points 2 points  Total Score 0 points 6 points 0 points 2 points    Immunizations Immunization History  Administered Date(s) Administered   Janssen (J&J) SARS-COV-2 Vaccination 08/27/2019   PFIZER(Purple Top)SARS-COV-2 Vaccination 06/17/2020   Pneumococcal Conjugate-13 09/04/2014, 11/19/2020   Pneumococcal Polysaccharide-23 12/13/2012, 10/05/2017   Tdap 12/21/2012    TDAP status: Due, Education has been provided regarding the importance of this vaccine. Advised may receive this vaccine at local pharmacy or Health Dept. Aware to provide a copy of the vaccination record if obtained from local pharmacy or Health Dept. Verbalized acceptance and understanding.  Flu Vaccine status: Up to  date  Pneumococcal vaccine status: Up to date  Covid-19 vaccine status: Completed vaccines  Qualifies for Shingles Vaccine? Yes   Zostavax completed No   Shingrix Completed?: No.    Education has been provided regarding the importance of this vaccine. Patient has been advised to call insurance company to determine out of pocket expense if they have not yet received this vaccine. Advised may also receive vaccine at local pharmacy or Health Dept. Verbalized acceptance and understanding.  Screening  Tests Health Maintenance  Topic Date Due   COVID-19 Vaccine (3 - 2023-24 season) 12/19/2022 (Originally 02/13/2022)   Zoster Vaccines- Shingrix (1 of 2) 03/05/2023 (Originally 10/02/1954)   DTaP/Tdap/Td (2 - Td or Tdap) 12/22/2022   INFLUENZA VACCINE  01/14/2023   Medicare Annual Wellness (AWV)  12/09/2023   Pneumonia Vaccine 64+ Years old  Completed   HPV VACCINES  Aged Out    Health Maintenance  There are no preventive care reminders to display for this patient.   Colorectal cancer screening: No longer required.   Lung Cancer Screening: (Low Dose CT Chest recommended if Age 31-80 years, 20 pack-year currently smoking OR have quit w/in 15years.) does not qualify.   Lung Cancer Screening Referral: n/a  Additional Screening:  Hepatitis C Screening: does not qualify;   Vision Screening: Recommended annual ophthalmology exams for early detection of glaucoma and other disorders of the eye. Is the patient up to date with their annual eye exam?  Yes  Who is the provider or what is the name of the office in which the patient attends annual eye exams? Dr.Burnett If pt is not established with a provider, would they like to be referred to a provider to establish care? No .   Dental Screening: Recommended annual dental exams for proper oral hygiene  Diabetic Foot Exam: Diabetic Foot Exam: Overdue, Pt has been advised about the importance in completing this exam. Pt is scheduled for diabetic foot exam on next office visit .  Community Resource Referral / Chronic Care Management: CRR required this visit?  No   CCM required this visit?  No     Plan:     I have personally reviewed and noted the following in the patient's chart:   Medical and social history Use of alcohol, tobacco or illicit drugs  Current medications and supplements including opioid prescriptions. Patient is not currently taking opioid prescriptions. Functional ability and status Nutritional status Physical  activity Advanced directives List of other physicians Hospitalizations, surgeries, and ER visits in previous 12 months Vitals Screenings to include cognitive, depression, and falls Referrals and appointments  In addition, I have reviewed and discussed with patient certain preventive protocols, quality metrics, and best practice recommendations. A written personalized care plan for preventive services as well as general preventive health recommendations were provided to patient.     Lorrene Reid, LPN   09/21/8117   After Visit Summary: (MyChart) Due to this being a telephonic visit, the after visit summary with patients personalized plan was offered to patient via MyChart   Nurse Notes: none

## 2022-12-09 NOTE — Patient Instructions (Signed)
Aaron Whitney , Thank you for taking time to come for your Medicare Wellness Visit. I appreciate your ongoing commitment to your health goals. Please review the following plan we discussed and let me know if I can assist you in the future.   These are the goals we discussed:  Goals      Blood Pressure < 140/90     DIET - REDUCE SALT INTAKE TO 2 GRAMS PER DAY OR LESS     Exercise 3x per week (30 min per time)     Reduce sugar intake to X grams per day     Follow wife's lead on portions of desserts, etc.        This is a list of the screening recommended for you and due dates:  Health Maintenance  Topic Date Due   COVID-19 Vaccine (3 - 2023-24 season) 12/19/2022*   Zoster (Shingles) Vaccine (1 of 2) 03/05/2023*   DTaP/Tdap/Td vaccine (2 - Td or Tdap) 12/22/2022   Flu Shot  01/14/2023   Medicare Annual Wellness Visit  12/09/2023   Pneumonia Vaccine  Completed   HPV Vaccine  Aged Out  *Topic was postponed. The date shown is not the original due date.    Advanced directives: Please bring a copy of your health care power of attorney and living will to the office to be added to your chart at your convenience.   Conditions/risks identified: Aim for 30 minutes of exercise or brisk walking, 6-8 glasses of water, and 5 servings of fruits and vegetables each day.   Next appointment: Follow up in one year for your annual wellness visit.   Preventive Care 37 Years and Older, Male  Preventive care refers to lifestyle choices and visits with your health care provider that can promote health and wellness. What does preventive care include? A yearly physical exam. This is also called an annual well check. Dental exams once or twice a year. Routine eye exams. Ask your health care provider how often you should have your eyes checked. Personal lifestyle choices, including: Daily care of your teeth and gums. Regular physical activity. Eating a healthy diet. Avoiding tobacco and drug  use. Limiting alcohol use. Practicing safe sex. Taking low doses of aspirin every day. Taking vitamin and mineral supplements as recommended by your health care provider. What happens during an annual well check? The services and screenings done by your health care provider during your annual well check will depend on your age, overall health, lifestyle risk factors, and family history of disease. Counseling  Your health care provider may ask you questions about your: Alcohol use. Tobacco use. Drug use. Emotional well-being. Home and relationship well-being. Sexual activity. Eating habits. History of falls. Memory and ability to understand (cognition). Work and work Astronomer. Screening  You may have the following tests or measurements: Height, weight, and BMI. Blood pressure. Lipid and cholesterol levels. These may be checked every 5 years, or more frequently if you are over 6 years old. Skin check. Lung cancer screening. You may have this screening every year starting at age 24 if you have a 30-pack-year history of smoking and currently smoke or have quit within the past 15 years. Fecal occult blood test (FOBT) of the stool. You may have this test every year starting at age 66. Flexible sigmoidoscopy or colonoscopy. You may have a sigmoidoscopy every 5 years or a colonoscopy every 10 years starting at age 75. Prostate cancer screening. Recommendations will vary depending on your family history  and other risks. Hepatitis C blood test. Hepatitis B blood test. Sexually transmitted disease (STD) testing. Diabetes screening. This is done by checking your blood sugar (glucose) after you have not eaten for a while (fasting). You may have this done every 1-3 years. Abdominal aortic aneurysm (AAA) screening. You may need this if you are a current or former smoker. Osteoporosis. You may be screened starting at age 32 if you are at high risk. Talk with your health care provider about  your test results, treatment options, and if necessary, the need for more tests. Vaccines  Your health care provider may recommend certain vaccines, such as: Influenza vaccine. This is recommended every year. Tetanus, diphtheria, and acellular pertussis (Tdap, Td) vaccine. You may need a Td booster every 10 years. Zoster vaccine. You may need this after age 9. Pneumococcal 13-valent conjugate (PCV13) vaccine. One dose is recommended after age 12. Pneumococcal polysaccharide (PPSV23) vaccine. One dose is recommended after age 36. Talk to your health care provider about which screenings and vaccines you need and how often you need them. This information is not intended to replace advice given to you by your health care provider. Make sure you discuss any questions you have with your health care provider. Document Released: 06/28/2015 Document Revised: 02/19/2016 Document Reviewed: 04/02/2015 Elsevier Interactive Patient Education  2017 [employer] Prevention in the Home Falls can cause injuries. They can happen to people of all ages. There are many things you can do to make your home safe and to help prevent falls. What can I do on the outside of my home? Regularly fix the edges of walkways and driveways and fix any cracks. Remove anything that might make you trip as you walk through a door, such as a raised step or threshold. Trim any bushes or trees on the path to your home. Use bright outdoor lighting. Clear any walking paths of anything that might make someone trip, such as rocks or tools. Regularly check to see if handrails are loose or broken. Make sure that both sides of any steps have handrails. Any raised decks and porches should have guardrails on the edges. Have any leaves, snow, or ice cleared regularly. Use sand or salt on walking paths during winter. Clean up any spills in your garage right away. This includes oil or grease spills. What can I do in the bathroom? Use  night lights. Install grab bars by the toilet and in the tub and shower. Do not use towel bars as grab bars. Use non-skid mats or decals in the tub or shower. If you need to sit down in the shower, use a plastic, non-slip stool. Keep the floor dry. Clean up any water that spills on the floor as soon as it happens. Remove soap buildup in the tub or shower regularly. Attach bath mats securely with double-sided non-slip rug tape. Do not have throw rugs and other things on the floor that can make you trip. What can I do in the bedroom? Use night lights. Make sure that you have a light by your bed that is easy to reach. Do not use any sheets or blankets that are too big for your bed. They should not hang down onto the floor. Have a firm chair that has side arms. You can use this for support while you get dressed. Do not have throw rugs and other things on the floor that can make you trip. What can I do in the kitchen? Clean up any spills  right away. Avoid walking on wet floors. Keep items that you use a lot in easy-to-reach places. If you need to reach something above you, use a strong step stool that has a grab bar. Keep electrical cords out of the way. Do not use floor polish or wax that makes floors slippery. If you must use wax, use non-skid floor wax. Do not have throw rugs and other things on the floor that can make you trip. What can I do with my stairs? Do not leave any items on the stairs. Make sure that there are handrails on both sides of the stairs and use them. Fix handrails that are broken or loose. Make sure that handrails are as long as the stairways. Check any carpeting to make sure that it is firmly attached to the stairs. Fix any carpet that is loose or worn. Avoid having throw rugs at the top or bottom of the stairs. If you do have throw rugs, attach them to the floor with carpet tape. Make sure that you have a light switch at the top of the stairs and the bottom of the  stairs. If you do not have them, ask someone to add them for you. What else can I do to help prevent falls? Wear shoes that: Do not have high heels. Have rubber bottoms. Are comfortable and fit you well. Are closed at the toe. Do not wear sandals. If you use a stepladder: Make sure that it is fully opened. Do not climb a closed stepladder. Make sure that both sides of the stepladder are locked into place. Ask someone to hold it for you, if possible. Clearly mark and make sure that you can see: Any grab bars or handrails. First and last steps. Where the edge of each step is. Use tools that help you move around (mobility aids) if they are needed. These include: Canes. Walkers. Scooters. Crutches. Turn on the lights when you go into a dark area. Replace any light bulbs as soon as they burn out. Set up your furniture so you have a clear path. Avoid moving your furniture around. If any of your floors are uneven, fix them. If there are any pets around you, be aware of where they are. Review your medicines with your doctor. Some medicines can make you feel dizzy. This can increase your chance of falling. Ask your doctor what other things that you can do to help prevent falls. This information is not intended to replace advice given to you by your health care provider. Make sure you discuss any questions you have with your health care provider. Document Released: 03/28/2009 Document Revised: 11/07/2015 Document Reviewed: 07/06/2014 Elsevier Interactive Patient Education  2017 [employer].

## 2023-01-26 ENCOUNTER — Encounter: Payer: Self-pay | Admitting: *Deleted

## 2023-02-17 ENCOUNTER — Encounter: Payer: Self-pay | Admitting: Family Medicine

## 2023-03-10 ENCOUNTER — Ambulatory Visit: Payer: Medicare Other | Admitting: Family Medicine

## 2023-03-22 ENCOUNTER — Encounter: Payer: Self-pay | Admitting: Family Medicine

## 2023-03-22 ENCOUNTER — Ambulatory Visit (INDEPENDENT_AMBULATORY_CARE_PROVIDER_SITE_OTHER): Payer: Medicare Other | Admitting: Family Medicine

## 2023-03-22 VITALS — BP 121/68 | HR 66 | Temp 97.4°F | Ht 67.0 in | Wt 171.2 lb

## 2023-03-22 DIAGNOSIS — M4716 Other spondylosis with myelopathy, lumbar region: Secondary | ICD-10-CM

## 2023-03-22 DIAGNOSIS — E782 Mixed hyperlipidemia: Secondary | ICD-10-CM | POA: Diagnosis not present

## 2023-03-22 DIAGNOSIS — I10 Essential (primary) hypertension: Secondary | ICD-10-CM | POA: Diagnosis not present

## 2023-03-22 DIAGNOSIS — M6259 Muscle wasting and atrophy, not elsewhere classified, multiple sites: Secondary | ICD-10-CM

## 2023-03-22 DIAGNOSIS — M150 Primary generalized (osteo)arthritis: Secondary | ICD-10-CM

## 2023-03-22 DIAGNOSIS — N401 Enlarged prostate with lower urinary tract symptoms: Secondary | ICD-10-CM

## 2023-03-22 DIAGNOSIS — R351 Nocturia: Secondary | ICD-10-CM

## 2023-03-22 MED ORDER — FINASTERIDE 5 MG PO TABS: 5.0000 mg | ORAL_TABLET | Freq: Every day | ORAL | 3 refills | Status: DC

## 2023-03-22 MED ORDER — SILODOSIN 8 MG PO CAPS: 8.0000 mg | ORAL_CAPSULE | Freq: Every day | ORAL | 3 refills | Status: DC

## 2023-03-22 NOTE — Progress Notes (Signed)
Subjective:  Patient ID: Aaron Whitney, male    DOB: 1935-09-11  Age: 87 y.o. MRN: [id number]  CC: Medical Management of Chronic Issues   HPI FAIZ SHIM presents for  follow-up of hypertension. Patient has no history of headache chest pain or shortness of breath or recent cough. Patient also denies symptoms of TIA such as focal numbness or weakness. Patient denies side effects from medication. States taking it regularly.  BPH treated with tamsulosin, but still up to bathroom 4-6 times a night.Declines cholesterol today. Not interested in treating.  Pt. Has hx of Ca Prostate 12 years ago. He notes a fairly rapid fall off in muscle mass and strength over the last few months. No longer taking testosterone suppression med.   Lots of snapping and popping with movement in several joints - back neck, hips, shoulders & knees      History Aaditya has a past medical history of Cancer (HCC) (2013), Hypertension, and Macular degeneration.   He has a past surgical history that includes Prostate surgery.   His family history includes Alcohol abuse in his father; Cancer in his mother.He reports that he has never smoked. He has never used smokeless tobacco. He reports that he does not drink alcohol and does not use drugs.  Current Outpatient Medications on File Prior to Visit  Medication Sig Dispense Refill   amLODipine (NORVASC) 10 MG tablet Take 1 tablet (10 mg total) by mouth daily. 90 tablet 2   carvedilol (COREG) 12.5 MG tablet Take 1 tablet (12.5 mg total) by mouth 2 (two) times daily with a meal. 180 tablet 3   fluticasone (FLONASE) 50 MCG/ACT nasal spray Place 2 sprays into both nostrils daily. 16 g 6   Misc Natural Products (OSTEO BI-FLEX TRIPLE STRENGTH) TABS Take 1 tablet by mouth 2 (two) times daily. 1 tablet 1   No current facility-administered medications on file prior to visit.    ROS Review of Systems  Constitutional:  Negative for fever.  Respiratory:   Negative for shortness of breath.   Cardiovascular:  Negative for chest pain.  Musculoskeletal:  Negative for arthralgias.  Skin:  Negative for rash.    Objective:  BP 121/68   Pulse 66   Temp (!) 97.4 F (36.3 C)   Ht 5\' 7"  (1.702 m)   Wt 171 lb 3.2 oz (77.7 kg)   SpO2 95%   BMI 26.81 kg/m   BP Readings from Last 3 Encounters:  03/22/23 121/68  12/03/22 135/70  08/26/22 120/67    Wt Readings from Last 3 Encounters:  03/22/23 171 lb 3.2 oz (77.7 kg)  12/09/22 173 lb (78.5 kg)  12/03/22 173 lb 3.2 oz (78.6 kg)     Physical Exam Vitals reviewed.  Constitutional:      Appearance: He is well-developed.  HENT:     Head: Normocephalic and atraumatic.     Right Ear: External ear normal.     Left Ear: External ear normal.     Mouth/Throat:     Pharynx: No oropharyngeal exudate or posterior oropharyngeal erythema.  Eyes:     Pupils: Pupils are equal, round, and reactive to light.  Cardiovascular:     Rate and Rhythm: Normal rate and regular rhythm.     Heart sounds: No murmur heard. Pulmonary:     Effort: No respiratory distress.     Breath sounds: Normal breath sounds.  Musculoskeletal:     Cervical back: Normal range of motion and neck supple.  Comments: Tone seems appropriate for age.   Neurological:     Mental Status: He is alert and oriented to person, place, and time.       Assessment & Plan:   Masuo "Merlyn Albert" was seen today for medical management of chronic issues.  Diagnoses and all orders for this visit:  Mixed hyperlipidemia  Essential hypertension -     CBC with Differential/Platelet -     CMP14+EGFR  Atrophy of muscle of multiple sites -     TSH -     Testosterone,Free and Total -     Cortisol-am, blood  Lumbar spondylosis with myelopathy  Benign prostatic hyperplasia with nocturia  Primary osteoarthritis involving multiple joints  Other orders -     silodosin (RAPAFLO) 8 MG CAPS capsule; Take 1 capsule (8 mg total) by mouth  daily. For urine flow (prostate) -     finasteride (PROSCAR) 5 MG tablet; Take 1 tablet (5 mg total) by mouth daily. For urine flow   Allergies as of 03/22/2023   No Known Allergies      Medication List        Accurate as of March 22, 2023 10:09 AM. If you have any questions, ask your nurse or doctor.          STOP taking these medications    tamsulosin 0.4 MG Caps capsule Commonly known as: FLOMAX Stopped by: Emmily Pellegrin       TAKE these medications    amLODipine 10 MG tablet Commonly known as: NORVASC Take 1 tablet (10 mg total) by mouth daily.   carvedilol 12.5 MG tablet Commonly known as: COREG Take 1 tablet (12.5 mg total) by mouth 2 (two) times daily with a meal.   finasteride 5 MG tablet Commonly known as: Proscar Take 1 tablet (5 mg total) by mouth daily. For urine flow Started by: Neri Vieyra   fluticasone 50 MCG/ACT nasal spray Commonly known as: FLONASE Place 2 sprays into both nostrils daily.   Osteo Bi-Flex Triple Strength Tabs Take 1 tablet by mouth 2 (two) times daily.   silodosin 8 MG Caps capsule Commonly known as: RAPAFLO Take 1 capsule (8 mg total) by mouth daily. For urine flow (prostate) Started by: Broadus John Loraine Freid        Meds ordered this encounter  Medications   silodosin (RAPAFLO) 8 MG CAPS capsule    Sig: Take 1 capsule (8 mg total) by mouth daily. For urine flow (prostate)    Dispense:  180 capsule    Refill:  3   finasteride (PROSCAR) 5 MG tablet    Sig: Take 1 tablet (5 mg total) by mouth daily. For urine flow    Dispense:  90 tablet    Refill:  3    Since the muscle weakness is rather sudden, I will check labs as above. Likely mostly age related. For the joints creaking, fish oil 2 g BID Follow-up: Return in about 3 months (around 06/22/2023).  Mechele Claude, M.D.

## 2023-03-25 ENCOUNTER — Other Ambulatory Visit: Payer: Medicare Other

## 2023-03-28 LAB — CMP14+EGFR
ALT: 13 [IU]/L (ref 0–44)
AST: 18 [IU]/L (ref 0–40)
Albumin: 4.5 g/dL (ref 3.7–4.7)
Alkaline Phosphatase: 61 [IU]/L (ref 44–121)
BUN/Creatinine Ratio: 19 (ref 10–24)
BUN: 14 mg/dL (ref 8–27)
Bilirubin Total: 0.6 mg/dL (ref 0.0–1.2)
CO2: 21 mmol/L (ref 20–29)
Calcium: 9.6 mg/dL (ref 8.6–10.2)
Chloride: 98 mmol/L (ref 96–106)
Creatinine, Ser: 0.74 mg/dL — ABNORMAL LOW (ref 0.76–1.27)
Globulin, Total: 2.5 g/dL (ref 1.5–4.5)
Glucose: 109 mg/dL — ABNORMAL HIGH (ref 70–99)
Potassium: 4.5 mmol/L (ref 3.5–5.2)
Sodium: 135 mmol/L (ref 134–144)
Total Protein: 7 g/dL (ref 6.0–8.5)
eGFR: 89 mL/min/{1.73_m2} (ref >59)

## 2023-03-28 LAB — CBC WITH DIFFERENTIAL/PLATELET
Basophils Absolute: 0 10*3/uL (ref 0.0–0.2)
Basos: 0 %
EOS (ABSOLUTE): 0.2 10*3/uL (ref 0.0–0.4)
Eos: 3 %
Hematocrit: 45.3 % (ref 37.5–51.0)
Hemoglobin: 14.9 g/dL (ref 13.0–17.7)
Immature Grans (Abs): 0 10*3/uL (ref 0.0–0.1)
Immature Granulocytes: 0 %
Lymphocytes Absolute: 1.6 10*3/uL (ref 0.7–3.1)
Lymphs: 21 %
MCH: 30.4 pg (ref 26.6–33.0)
MCHC: 32.9 g/dL (ref 31.5–35.7)
MCV: 92 fL (ref 79–97)
Monocytes Absolute: 0.7 10*3/uL (ref 0.1–0.9)
Monocytes: 9 %
Neutrophils Absolute: 5.3 10*3/uL (ref 1.4–7.0)
Neutrophils: 67 %
Platelets: 232 10*3/uL (ref 150–450)
RBC: 4.9 x10E6/uL (ref 4.14–5.80)
RDW: 13.1 % (ref 11.6–15.4)
WBC: 7.9 10*3/uL (ref 3.4–10.8)

## 2023-03-28 LAB — TESTOSTERONE,FREE AND TOTAL
Testosterone, Free: 3.2 pg/mL — ABNORMAL LOW (ref 6.6–18.1)
Testosterone: 401 ng/dL (ref 264–916)

## 2023-03-28 LAB — TSH: TSH: 2.46 u[IU]/mL (ref 0.450–4.500)

## 2023-03-28 LAB — CORTISOL-AM, BLOOD: Cortisol - AM: 13 ug/dL (ref 6.2–19.4)

## 2023-03-28 NOTE — Progress Notes (Signed)
Hello L[place]on,  Your lab result is normal and/or stable.Some minor variations that are not significant are commonly marked abnormal, but do not represent any medical problem for you.  Best regards, Mechele Claude, M.D.

## 2023-06-23 ENCOUNTER — Encounter: Payer: Self-pay | Admitting: Family Medicine

## 2023-06-23 ENCOUNTER — Ambulatory Visit: Payer: Medicare Other | Admitting: Family Medicine

## 2023-06-23 VITALS — BP 128/65 | HR 66 | Temp 97.4°F | Ht 67.0 in

## 2023-06-23 DIAGNOSIS — E782 Mixed hyperlipidemia: Secondary | ICD-10-CM

## 2023-06-23 DIAGNOSIS — I471 Supraventricular tachycardia, unspecified: Secondary | ICD-10-CM | POA: Diagnosis not present

## 2023-06-23 DIAGNOSIS — R531 Weakness: Secondary | ICD-10-CM | POA: Diagnosis not present

## 2023-06-23 DIAGNOSIS — I10 Essential (primary) hypertension: Secondary | ICD-10-CM | POA: Diagnosis not present

## 2023-06-23 DIAGNOSIS — R2689 Other abnormalities of gait and mobility: Secondary | ICD-10-CM

## 2023-06-23 LAB — CBC WITH DIFFERENTIAL/PLATELET

## 2023-06-23 MED ORDER — FUROSEMIDE 20 MG PO TABS: 10.0000 mg | ORAL_TABLET | Freq: Every day | ORAL | 2 refills | Status: DC

## 2023-06-23 MED ORDER — AMLODIPINE BESYLATE 10 MG PO TABS: 10.0000 mg | ORAL_TABLET | Freq: Every day | ORAL | 3 refills | Status: DC

## 2023-06-23 MED ORDER — CARVEDILOL 12.5 MG PO TABS: 12.5000 mg | ORAL_TABLET | Freq: Two times a day (BID) | ORAL | 3 refills | Status: DC

## 2023-06-23 NOTE — Progress Notes (Signed)
 Subjective:  Patient ID: Aaron Whitney, male    DOB: 03/21/1936  Age: 88 y.o. MRN: [id number]  CC: Follow-up (No concerns at this time)   HPI Aaron Whitney presents for Svt. Still feeling skippped beats occasionally. Asymptomatic. Feeling weak, losing strength. Also feels popping in the spine, neck and collar bones.  Up 2-4 times a night. Feels that it has been hard for him. Most nights up 4 times. Passing a large amount of urine.      06/23/2023   10:09 AM 03/22/2023    9:34 AM 12/09/2022    2:15 PM  Depression screen PHQ 2/9  Decreased Interest 0 0 0  Down, Depressed, Hopeless 0 0 0  PHQ - 2 Score 0 0 0    History Aaron Whitney has a past medical history of Cancer (HCC) (2013), Hypertension, and Macular degeneration.   He has a past surgical history that includes Prostate surgery.   His family history includes Alcohol abuse in his father; Cancer in his mother.He reports that he has never smoked. He has never used smokeless tobacco. He reports that he does not drink alcohol and does not use drugs.    ROS Review of Systems  Constitutional:  Negative for fever.  Respiratory:  Negative for shortness of breath.   Cardiovascular:  Negative for chest pain.  Musculoskeletal:  Negative for arthralgias.  Skin:  Negative for rash.    Objective:  BP 128/65   Pulse 66   Temp (!) 97.4 F (36.3 C) (Temporal)   Ht 5' 7 (1.702 m)   SpO2 94%   BMI 26.81 kg/m   BP Readings from Last 3 Encounters:  06/23/23 128/65  03/22/23 121/68  12/03/22 135/70    Wt Readings from Last 3 Encounters:  03/22/23 171 lb 3.2 oz (77.7 kg)  12/09/22 173 lb (78.5 kg)  12/03/22 173 lb 3.2 oz (78.6 kg)     Physical Exam Vitals reviewed.  Constitutional:      Appearance: He is well-developed.  HENT:     Head: Normocephalic and atraumatic.     Right Ear: External ear normal.     Left Ear: External ear normal.     Mouth/Throat:     Pharynx: No oropharyngeal exudate or posterior  oropharyngeal erythema.  Eyes:     Pupils: Pupils are equal, round, and reactive to light.  Cardiovascular:     Rate and Rhythm: Normal rate and regular rhythm.     Heart sounds: No murmur heard. Pulmonary:     Effort: No respiratory distress.     Breath sounds: Normal breath sounds.  Musculoskeletal:     Cervical back: Normal range of motion and neck supple.  Neurological:     Mental Status: He is alert and oriented to person, place, and time.    EKG NSR at 60 (boderline for bradycardia) RBBB. Nml indices and segments. No ischemic changes   Assessment & Plan:   Aaron Whitney was seen today for follow-up.  Diagnoses and all orders for this visit:  Essential hypertension -     CMP14+EGFR -     CBC with Differential/Platelet -     Lipid panel -     carvedilol  (COREG ) 12.5 MG tablet; Take 1 tablet (12.5 mg total) by mouth 2 (two) times daily with a meal.  Mixed hyperlipidemia -     CMP14+EGFR -     CBC with Differential/Platelet -     Lipid panel  SVT (supraventricular tachycardia) (HCC) -  EKG 12-Lead  Weakness -     CK -     Ambulatory referral to Physical Therapy  Imbalance -     CK -     Ambulatory referral to Physical Therapy  Other orders -     amLODipine  (NORVASC ) 10 MG tablet; Take 1 tablet (10 mg total) by mouth daily. -     furosemide  (LASIX ) 20 MG tablet; Take 0.5 tablets (10 mg total) by mouth daily.       I am having Aaron Whitney start on furosemide . I am also having him maintain his Osteo Bi-Flex Triple Strength, fluticasone , silodosin , finasteride , carvedilol , and amLODipine .  Allergies as of 06/23/2023   No Known Allergies      Medication List        Accurate as of June 23, 2023 11:06 AM. If you have any questions, ask your nurse or doctor.          amLODipine  10 MG tablet Commonly known as: NORVASC  Take 1 tablet (10 mg total) by mouth daily.   carvedilol  12.5 MG tablet Commonly known as: COREG  Take 1  tablet (12.5 mg total) by mouth 2 (two) times daily with a meal.   finasteride  5 MG tablet Commonly known as: Proscar  Take 1 tablet (5 mg total) by mouth daily. For urine flow   fluticasone  50 MCG/ACT nasal spray Commonly known as: FLONASE  Place 2 sprays into both nostrils daily.   furosemide  20 MG tablet Commonly known as: LASIX  Take 0.5 tablets (10 mg total) by mouth daily. Started by: Lauriana Denes   Osteo Bi-Flex Triple Strength Tabs Take 1 tablet by mouth 2 (two) times daily.   silodosin  8 MG Caps capsule Commonly known as: RAPAFLO  Take 1 capsule (8 mg total) by mouth daily. For urine flow (prostate)         Follow-up: No follow-ups on file.  Butler Der, M.D.

## 2023-06-24 LAB — CBC WITH DIFFERENTIAL/PLATELET
Basos: 0 %
EOS (ABSOLUTE): 0 10*3/uL (ref 0.0–0.2)
Eos: 2 %
Hematocrit: 46.3 % (ref 37.5–51.0)
Hemoglobin: 15.6 g/dL (ref 13.0–17.7)
Immature Granulocytes: 0 %
Immature Granulocytes: 0 10*3/uL (ref 0.0–0.1)
Lymphs: 20 %
MCH: 30.9 pg (ref 26.6–33.0)
MCHC: 33.7 g/dL (ref 31.5–35.7)
MCV: 92 fL (ref 79–97)
Monocytes Absolute: 0.2 10*3/uL (ref 0.0–0.4)
Monocytes Absolute: 0.8 10*3/uL (ref 0.1–0.9)
Monocytes: 9 %
Neutrophils Absolute: 1.8 10*3/uL (ref 0.7–3.1)
Neutrophils Absolute: 5.9 10*3/uL (ref 1.4–7.0)
Neutrophils: 69 %
Platelets: 262 10*3/uL (ref 150–450)
RBC: 5.05 x10E6/uL (ref 4.14–5.80)
RDW: 12.7 % (ref 11.6–15.4)
WBC: 8.7 10*3/uL (ref 3.4–10.8)

## 2023-06-24 LAB — CMP14+EGFR
ALT: 11 IU/L (ref 0–44)
AST: 25 IU/L (ref 0–40)
Albumin: 4.5 g/dL (ref 3.7–4.7)
Alkaline Phosphatase: 73 IU/L (ref 44–121)
BUN/Creatinine Ratio: 20 (ref 10–24)
BUN: 17 mg/dL (ref 8–27)
Bilirubin Total: 0.7 mg/dL (ref 0.0–1.2)
CO2: 24 mmol/L (ref 20–29)
Calcium: 9.8 mg/dL (ref 8.6–10.2)
Chloride: 97 mmol/L (ref 96–106)
Creatinine, Ser: 0.83 mg/dL (ref 0.76–1.27)
Globulin, Total: 2.8 g/dL (ref 1.5–4.5)
Glucose: 116 mg/dL — ABNORMAL HIGH (ref 70–99)
Potassium: 4.8 mmol/L (ref 3.5–5.2)
Sodium: 137 mmol/L (ref 134–144)
Total Protein: 7.3 g/dL (ref 6.0–8.5)
eGFR: 86 mL/min/{1.73_m2} (ref >59)

## 2023-06-24 LAB — LIPID PANEL
Chol/HDL Ratio: 6 ratio — ABNORMAL HIGH (ref 0.0–5.0)
Cholesterol, Total: 281 mg/dL — ABNORMAL HIGH (ref 100–199)
HDL: 47 mg/dL (ref >39)
LDL Chol Calc (NIH): 216 mg/dL — ABNORMAL HIGH (ref 0–99)
Triglycerides: 102 mg/dL (ref 0–149)
VLDL Cholesterol Cal: 18 mg/dL (ref 5–40)

## 2023-06-24 LAB — CK: Total CK: 101 U/L (ref 30–208)

## 2023-07-01 ENCOUNTER — Encounter: Payer: Self-pay | Admitting: Family Medicine

## 2023-07-05 ENCOUNTER — Other Ambulatory Visit: Payer: Self-pay | Admitting: Family Medicine

## 2023-07-05 MED ORDER — NEXLIZET 180-10 MG PO TABS: 1.0000 | ORAL_TABLET | Freq: Every day | ORAL | 2 refills | Status: DC

## 2023-07-12 ENCOUNTER — Telehealth: Payer: Self-pay | Admitting: Family Medicine

## 2023-07-12 NOTE — Telephone Encounter (Signed)
FYI  Copied from CRM [id number]. Topic: Referral - Status >> Jul 12, 2023 10:32 AM Dondra Prader A wrote: Reason for CRM: Megan with [facility] is calling in regards to a referral that was sent to them for the patient. Per Aundra Millet they have tried contacting the patient for a few weeks and have gotten no response from the patient. They are going to have to expire the referral.

## 2023-09-21 ENCOUNTER — Encounter: Payer: Self-pay | Admitting: Family Medicine

## 2023-09-21 ENCOUNTER — Ambulatory Visit (INDEPENDENT_AMBULATORY_CARE_PROVIDER_SITE_OTHER): Payer: Medicare Other | Admitting: Family Medicine

## 2023-09-21 VITALS — BP 154/77 | HR 73 | Temp 98.0°F | Ht 67.0 in | Wt 174.0 lb

## 2023-09-21 DIAGNOSIS — N401 Enlarged prostate with lower urinary tract symptoms: Secondary | ICD-10-CM | POA: Diagnosis not present

## 2023-09-21 DIAGNOSIS — I10 Essential (primary) hypertension: Secondary | ICD-10-CM | POA: Diagnosis not present

## 2023-09-21 DIAGNOSIS — I471 Supraventricular tachycardia, unspecified: Secondary | ICD-10-CM

## 2023-09-21 DIAGNOSIS — E782 Mixed hyperlipidemia: Secondary | ICD-10-CM | POA: Diagnosis not present

## 2023-09-21 DIAGNOSIS — R351 Nocturia: Secondary | ICD-10-CM | POA: Diagnosis not present

## 2023-09-21 DIAGNOSIS — C61 Malignant neoplasm of prostate: Secondary | ICD-10-CM

## 2023-09-21 NOTE — Progress Notes (Signed)
 Subjective:  Patient ID: Aaron Whitney, male    DOB: 19-Mar-1936  Age: 88 y.o. MRN: [id number]  CC: Medical Management of Chronic Issues   HPI STEPHEN BARUCH presents for follow-up of hypertension. Patient has no history of headache chest pain or shortness of breath or recent cough. Patient also denies symptoms of TIA such as numbness weakness lateralizing. Patient checks  blood pressure at home and has not had any elevated readings recently. Patient denies side effects from his medication. States taking it regularly. Patient states that he has not yet started the cholesterol medication.  His biggest concern today is actually that he continues to have nocturia 4 times a night or more.  This is in spite of using the finasteride as well as Rapaflo.  He verifies that he is taking half a Lasix tablet but just in the morning.  He does report diminished energy explained to him this may well be related to the carvedilol use.     09/21/2023   12:22 PM 06/23/2023   10:09 AM 03/22/2023    9:34 AM  Depression screen PHQ 2/9  Decreased Interest 0 0 0  Down, Depressed, Hopeless 0 0 0  PHQ - 2 Score 0 0 0  Altered sleeping 0    Tired, decreased energy 0    Change in appetite 0    Feeling bad or failure about yourself  0    Trouble concentrating 0    Moving slowly or fidgety/restless 0    Suicidal thoughts 0    PHQ-9 Score 0    Difficult doing work/chores Not difficult at all      History Jeffren has a past medical history of Cancer (HCC) (2013), Hypertension, and Macular degeneration.   He has a past surgical history that includes Prostate surgery.   His family history includes Alcohol abuse in his father; Cancer in his mother.He reports that he has never smoked. He has never used smokeless tobacco. He reports that he does not drink alcohol and does not use drugs.    ROS Review of Systems  Constitutional:  Positive for fatigue. Negative for fever.  Respiratory:  Negative for  shortness of breath.   Cardiovascular:  Negative for chest pain.  Genitourinary:  Positive for frequency.  Musculoskeletal:  Negative for arthralgias.  Skin:  Negative for rash.    Objective:  BP (!) 154/77   Pulse 73   Temp 98 F (36.7 C)   Ht 5\' 7"  (1.702 m)   Wt 174 lb (78.9 kg)   SpO2 94%   BMI 27.25 kg/m   BP Readings from Last 3 Encounters:  09/21/23 (!) 154/77  06/23/23 128/65  03/22/23 121/68    Wt Readings from Last 3 Encounters:  09/21/23 174 lb (78.9 kg)  03/22/23 171 lb 3.2 oz (77.7 kg)  12/09/22 173 lb (78.5 kg)     Physical Exam Vitals reviewed.  Constitutional:      Appearance: He is well-developed.  HENT:     Head: Normocephalic and atraumatic.     Right Ear: External ear normal.     Left Ear: External ear normal.     Mouth/Throat:     Pharynx: No oropharyngeal exudate or posterior oropharyngeal erythema.  Eyes:     Pupils: Pupils are equal, round, and reactive to light.  Cardiovascular:     Rate and Rhythm: Normal rate and regular rhythm.     Heart sounds: No murmur heard. Pulmonary:     Effort: No respiratory  distress.     Breath sounds: Normal breath sounds.  Musculoskeletal:     Cervical back: Normal range of motion and neck supple.  Neurological:     Mental Status: He is alert and oriented to person, place, and time.      Assessment & Plan:  Benign prostatic hyperplasia with nocturia -     BMP8+EGFR -     PSA, total and free -     Ambulatory referral to Urology  CA of prostate (HCC) -     PSA, total and free -     Ambulatory referral to Urology  Essential hypertension -     BMP8+EGFR  Mixed hyperlipidemia -     BMP8+EGFR  SVT (supraventricular tachycardia) (HCC) -     BMP8+EGFR   Patient was encouraged to pursue use of the next visit asScribed.  He plans to do that right away.  He realizes that he has gone through most if not all of the options for medication treatment for his prostate problem.  He has a urologist who s  case it becomes necessary.  Aw him for his prostate cancer.  He has not seen him in a couple years.  He is going to try to give them a call and will make a referral just in  Follow-up: Return in about 3 months (around 12/21/2023).  Mechele Claude, M.D.

## 2023-09-22 ENCOUNTER — Encounter: Payer: Self-pay | Admitting: Family Medicine

## 2023-09-22 LAB — PSA, TOTAL AND FREE
PSA, Free Pct: 5 %
PSA, Free: 0.06 ng/mL
Prostate Specific Ag, Serum: 1.2 ng/mL (ref 0.0–4.0)

## 2023-09-22 LAB — BMP8+EGFR
BUN/Creatinine Ratio: 13 (ref 10–24)
BUN: 12 mg/dL (ref 8–27)
CO2: 22 mmol/L (ref 20–29)
Calcium: 9.9 mg/dL (ref 8.6–10.2)
Chloride: 97 mmol/L (ref 96–106)
Creatinine, Ser: 0.9 mg/dL (ref 0.76–1.27)
Glucose: 111 mg/dL — ABNORMAL HIGH (ref 70–99)
Potassium: 4.6 mmol/L (ref 3.5–5.2)
Sodium: 133 mmol/L — ABNORMAL LOW (ref 134–144)
eGFR: 84 mL/min/{1.73_m2} (ref >59)

## 2023-09-22 NOTE — Progress Notes (Signed)
Hello L[place]on,  Your lab result is normal and/or stable.Some minor variations that are not significant are commonly marked abnormal, but do not represent any medical problem for you.  Best regards, Mechele Claude, M.D.

## 2023-12-22 ENCOUNTER — Encounter: Payer: Self-pay | Admitting: Family Medicine

## 2023-12-22 ENCOUNTER — Ambulatory Visit (INDEPENDENT_AMBULATORY_CARE_PROVIDER_SITE_OTHER): Admitting: Family Medicine

## 2023-12-22 VITALS — BP 122/68 | HR 63 | Temp 97.9°F | Ht 67.0 in | Wt 171.0 lb

## 2023-12-22 DIAGNOSIS — N401 Enlarged prostate with lower urinary tract symptoms: Secondary | ICD-10-CM | POA: Diagnosis not present

## 2023-12-22 DIAGNOSIS — R351 Nocturia: Secondary | ICD-10-CM

## 2023-12-22 DIAGNOSIS — E782 Mixed hyperlipidemia: Secondary | ICD-10-CM

## 2023-12-22 DIAGNOSIS — I10 Essential (primary) hypertension: Secondary | ICD-10-CM

## 2023-12-22 DIAGNOSIS — H353290 Exudative age-related macular degeneration, unspecified eye, stage unspecified: Secondary | ICD-10-CM

## 2023-12-22 MED ORDER — FINASTERIDE 5 MG PO TABS: 5.0000 mg | ORAL_TABLET | Freq: Every day | ORAL | 3 refills | Status: AC

## 2023-12-22 NOTE — Progress Notes (Signed)
 Subjective:  Patient ID: Aaron Whitney, male    DOB: 26-Apr-1936  Age: 88 y.o. MRN: [id number]  CC: Medical Management of Chronic Issues and Fatigue (Pt has been noticing loss of muscle mass, strength and more fatigued. Pats few months. Appetite I fine.)   HPI NARAYAN SCULL presents for loss of energy, strength, muscle mass, Bones making a new noise in back neck and shoulders. Popping.  Up from 2-6 times a night to urinate. Helps to avoid drinking in the evening.      12/22/2023   11:15 AM 09/21/2023   12:22 PM 06/23/2023   10:09 AM  Depression screen PHQ 2/9  Decreased Interest 0 0 0  Down, Depressed, Hopeless 0 0 0  PHQ - 2 Score 0 0 0  Altered sleeping 0 0   Tired, decreased energy 2 0   Change in appetite 0 0   Feeling bad or failure about yourself  0 0   Trouble concentrating 0 0   Moving slowly or fidgety/restless 0 0   Suicidal thoughts 0 0   PHQ-9 Score 2 0   Difficult doing work/chores Not difficult at all Not difficult at all     History Briston has a past medical history of Cancer (HCC) (2013), Hypertension, and Macular degeneration.   He has a past surgical history that includes Prostate surgery.   His family history includes Alcohol abuse in his father; Cancer in his mother.He reports that he has never smoked. He has never used smokeless tobacco. He reports that he does not drink alcohol and does not use drugs.    ROS Review of Systems  Constitutional:  Positive for fatigue. Negative for fever.  Respiratory:  Negative for shortness of breath.   Cardiovascular:  Negative for chest pain.  Musculoskeletal:  Negative for arthralgias.  Skin:  Negative for rash.  Neurological:  Positive for weakness.    Objective:  BP 122/68   Pulse 63   Temp 97.9 F (36.6 C)   Ht 5' 7 (1.702 m)   Wt 171 lb (77.6 kg)   SpO2 94%   BMI 26.78 kg/m   BP Readings from Last 3 Encounters:  12/22/23 122/68  09/21/23 (!) 154/77  06/23/23 128/65    Wt  Readings from Last 3 Encounters:  12/22/23 171 lb (77.6 kg)  09/21/23 174 lb (78.9 kg)  03/22/23 171 lb 3.2 oz (77.7 kg)     Physical Exam Vitals reviewed.  Constitutional:      Appearance: He is well-developed.  HENT:     Head: Normocephalic and atraumatic.     Right Ear: External ear normal.     Left Ear: External ear normal.     Mouth/Throat:     Pharynx: No oropharyngeal exudate or posterior oropharyngeal erythema.  Eyes:     Pupils: Pupils are equal, round, and reactive to light.  Cardiovascular:     Rate and Rhythm: Normal rate and regular rhythm.     Heart sounds: No murmur heard. Pulmonary:     Effort: No respiratory distress.     Breath sounds: Normal breath sounds.  Musculoskeletal:     Cervical back: Normal range of motion and neck supple.  Neurological:     Mental Status: He is alert and oriented to person, place, and time.      Assessment & Plan:  Benign prostatic hyperplasia with nocturia -     Testosterone ,Free and Total -     CBC with Differential/Platelet -  CMP14+EGFR -     TSH  Exudative age-related macular degeneration, unspecified laterality, unspecified stage (HCC) -     Testosterone ,Free and Total -     CBC with Differential/Platelet -     CMP14+EGFR -     TSH  Mixed hyperlipidemia -     Testosterone ,Free and Total -     CBC with Differential/Platelet -     CMP14+EGFR -     TSH  Essential hypertension -     Testosterone ,Free and Total -     CBC with Differential/Platelet -     CMP14+EGFR -     TSH  Other orders -     Finasteride ; Take 1 tablet (5 mg total) by mouth daily. For urine flow  Dispense: 90 tablet; Refill: 3  Consider other medications for BPH.  I suspect the number of times he is getting up per night has a lot to do with his fatigue.  However checking thyroid  testosterone  anemia and organ functions is essential before assuming this.   Follow-up: Return in about 1 month (around 01/22/2024).  Butler Der, M.D.

## 2023-12-22 NOTE — Patient Instructions (Addendum)
 Take Finasteride  every day. It will help you sleep better by reducing your night time trips to the bathroom.  Take Lasix  only in the morning.

## 2023-12-23 ENCOUNTER — Ambulatory Visit: Payer: Self-pay | Admitting: Family Medicine

## 2023-12-23 ENCOUNTER — Other Ambulatory Visit: Payer: Self-pay | Admitting: Family Medicine

## 2023-12-24 LAB — CMP14+EGFR
ALT: 10 IU/L (ref 0–44)
AST: 16 IU/L (ref 0–40)
Albumin: 4.5 g/dL (ref 3.7–4.7)
Alkaline Phosphatase: 68 IU/L (ref 44–121)
BUN/Creatinine Ratio: 15 (ref 10–24)
BUN: 14 mg/dL (ref 8–27)
Bilirubin Total: 0.6 mg/dL (ref 0.0–1.2)
CO2: 19 mmol/L — ABNORMAL LOW (ref 20–29)
Calcium: 9.6 mg/dL (ref 8.6–10.2)
Chloride: 96 mmol/L (ref 96–106)
Creatinine, Ser: 0.92 mg/dL (ref 0.76–1.27)
Globulin, Total: 2.4 g/dL (ref 1.5–4.5)
Glucose: 109 mg/dL — ABNORMAL HIGH (ref 70–99)
Potassium: 4.3 mmol/L (ref 3.5–5.2)
Sodium: 134 mmol/L (ref 134–144)
Total Protein: 6.9 g/dL (ref 6.0–8.5)
eGFR: 81 mL/min/1.73 (ref >59)

## 2023-12-24 LAB — CBC WITH DIFFERENTIAL/PLATELET
Basophils Absolute: 0 x10E3/uL (ref 0.0–0.2)
Basos: 0 %
EOS (ABSOLUTE): 0.2 x10E3/uL (ref 0.0–0.4)
Eos: 3 %
Hematocrit: 44.4 % (ref 37.5–51.0)
Hemoglobin: 14.7 g/dL (ref 13.0–17.7)
Immature Grans (Abs): 0 x10E3/uL (ref 0.0–0.1)
Immature Granulocytes: 0 %
Lymphocytes Absolute: 1.8 x10E3/uL (ref 0.7–3.1)
Lymphs: 21 %
MCH: 31.2 pg (ref 26.6–33.0)
MCHC: 33.1 g/dL (ref 31.5–35.7)
MCV: 94 fL (ref 79–97)
Monocytes Absolute: 0.9 x10E3/uL (ref 0.1–0.9)
Monocytes: 10 %
Neutrophils Absolute: 5.9 x10E3/uL (ref 1.4–7.0)
Neutrophils: 66 %
Platelets: 246 x10E3/uL (ref 150–450)
RBC: 4.71 x10E6/uL (ref 4.14–5.80)
RDW: 13.3 % (ref 11.6–15.4)
WBC: 8.9 x10E3/uL (ref 3.4–10.8)

## 2023-12-24 LAB — TSH: TSH: 2.1 u[IU]/mL (ref 0.450–4.500)

## 2023-12-24 LAB — TESTOSTERONE,FREE AND TOTAL
Testosterone, Free: 3.8 pg/mL — ABNORMAL LOW (ref 6.6–18.1)
Testosterone: 433 ng/dL (ref 264–916)

## 2024-01-26 ENCOUNTER — Encounter: Payer: Self-pay | Admitting: Family Medicine

## 2024-01-26 ENCOUNTER — Ambulatory Visit (INDEPENDENT_AMBULATORY_CARE_PROVIDER_SITE_OTHER): Admitting: Family Medicine

## 2024-01-26 VITALS — BP 106/57 | HR 66 | Temp 96.9°F | Ht 67.0 in | Wt 173.2 lb

## 2024-01-26 DIAGNOSIS — N401 Enlarged prostate with lower urinary tract symptoms: Secondary | ICD-10-CM

## 2024-01-26 DIAGNOSIS — E782 Mixed hyperlipidemia: Secondary | ICD-10-CM | POA: Diagnosis not present

## 2024-01-26 DIAGNOSIS — R351 Nocturia: Secondary | ICD-10-CM

## 2024-01-26 DIAGNOSIS — F5101 Primary insomnia: Secondary | ICD-10-CM | POA: Diagnosis not present

## 2024-01-26 DIAGNOSIS — I10 Essential (primary) hypertension: Secondary | ICD-10-CM | POA: Diagnosis not present

## 2024-01-26 MED ORDER — TRAZODONE HCL 50 MG PO TABS
50.0000 mg | ORAL_TABLET | Freq: Every day | ORAL | 1 refills | Status: AC
Start: 2024-01-26 — End: ?

## 2024-01-26 MED ORDER — ROSUVASTATIN CALCIUM 5 MG PO TABS: 5.0000 mg | ORAL_TABLET | Freq: Every day | ORAL | 3 refills | Status: AC

## 2024-01-26 NOTE — Progress Notes (Signed)
 Subjective:  Patient ID: Aaron Whitney, male    DOB: 10/12/1935  Age: 88 y.o. MRN: [id number]  CC: Follow-up   HPI  Discussed the use of AI scribe software for clinical note transcription with the patient, who gave verbal consent to proceed.  History of Present Illness   Aaron Whitney is an 88 year old male who presents for follow-up of nocturia and palpitations.  He experiences palpitations described as 'quick beats on the heart' and frequent nocturnal urination. The frequency of urination at night has improved slightly since last month. He is currently taking finasteride  and silodosin  for prostate issues, which he has been on for about a month. He previously used tamsulosin  and silodosin  under the care of another provider.  He notes a significant loss of muscle mass and strength, contributing to low energy levels.  His blood sugar was recorded at 109 mg/dL during a recent test conducted at noon, which he attributes to having eaten earlier that day. His cholesterol was last checked in January, showing a level of 281 mg/dL. He is not currently taking rosuvastatin  or Nexlizet  due to cost and insurance coverage issues.  He uses an over-the-counter sleep aid but continues to experience sleep disturbances due to nocturia, waking every two hours to urinate. He has not yet started any prescription sleep medication.             01/26/2024   11:23 AM 12/22/2023   11:15 AM 09/21/2023   12:22 PM  Depression screen PHQ 2/9  Decreased Interest 0 0 0  Down, Depressed, Hopeless 0 0 0  PHQ - 2 Score 0 0 0  Altered sleeping  0 0  Tired, decreased energy  2 0  Change in appetite  0 0  Feeling bad or failure about yourself   0 0  Trouble concentrating  0 0  Moving slowly or fidgety/restless  0 0  Suicidal thoughts  0 0  PHQ-9 Score  2 0  Difficult doing work/chores  Not difficult at all Not difficult at all    History Itay has a past medical history of Cancer  (HCC) (2013), Hypertension, and Macular degeneration.   He has a past surgical history that includes Prostate surgery.   His family history includes Alcohol abuse in his father; Cancer in his mother.He reports that he has never smoked. He has never used smokeless tobacco. He reports that he does not drink alcohol and does not use drugs.    ROS Review of Systems  Constitutional:  Negative for fever.  Respiratory:  Negative for shortness of breath.   Cardiovascular:  Negative for chest pain.  Genitourinary:  Positive for frequency.  Musculoskeletal:  Negative for arthralgias.  Skin:  Negative for rash.  Psychiatric/Behavioral:  Positive for sleep disturbance.     Objective:  BP (!) 106/57   Pulse 66   Temp (!) 96.9 F (36.1 C)   Ht 5' 7 (1.702 m)   Wt 173 lb 3.2 oz (78.6 kg)   SpO2 92%   BMI 27.13 kg/m   BP Readings from Last 3 Encounters:  01/26/24 (!) 106/57  12/22/23 122/68  09/21/23 (!) 154/77    Wt Readings from Last 3 Encounters:  01/26/24 173 lb 3.2 oz (78.6 kg)  12/22/23 171 lb (77.6 kg)  09/21/23 174 lb (78.9 kg)     Physical Exam Vitals reviewed.  Constitutional:      Appearance: He is well-developed.  HENT:     Head: Normocephalic and  atraumatic.     Right Ear: External ear normal.     Left Ear: External ear normal.     Mouth/Throat:     Pharynx: No oropharyngeal exudate or posterior oropharyngeal erythema.  Eyes:     Pupils: Pupils are equal, round, and reactive to light.  Cardiovascular:     Rate and Rhythm: Normal rate and regular rhythm.     Heart sounds: No murmur heard. Pulmonary:     Effort: No respiratory distress.     Breath sounds: Normal breath sounds.  Musculoskeletal:     Cervical back: Normal range of motion and neck supple.  Neurological:     Mental Status: He is alert and oriented to person, place, and time.      Assessment & Plan:  Benign prostatic hyperplasia with nocturia  Essential hypertension  Mixed  hyperlipidemia -     Lipid panel  Primary insomnia  Other orders -     Rosuvastatin  Calcium ; Take 1 tablet (5 mg total) by mouth daily. For cholesterol  Dispense: 90 tablet; Refill: 3 -     traZODone  HCl; Take 1 tablet (50 mg total) by mouth at bedtime.  Dispense: 90 tablet; Refill: 1    Assessment and Plan    Benign prostatic hyperplasia with lower urinary tract symptoms Persistent nocturia with slight improvement on current medication. - Continue finasteride . - Continue silodosin .  Insomnia Likely secondary to nocturia, contributing to daytime fatigue and muscle weakness. - Prescribe trazodone  50 mg at bedtime. - Adjust trazodone  dose based on response.  Muscle weakness and loss of muscle mass Likely related to aging, possibly exacerbated by poor sleep. - Encourage daily exercise, including walking, light weightlifting, and calisthenics. - Recommend increasing protein intake, possibly with protein shakes.  Hyperlipidemia Cholesterol level at 281 mg/dL, significantly elevated. Open to restarting rosuvastatin . - Prescribe rosuvastatin  (Crestor ). - Order lipid profile.          Follow-up: Return in about 1 month (around 02/26/2024).  Butler Der, M.D.

## 2024-01-27 LAB — LIPID PANEL
Chol/HDL Ratio: 4.5 ratio (ref 0.0–5.0)
Cholesterol, Total: 242 mg/dL — ABNORMAL HIGH (ref 100–199)
HDL: 54 mg/dL (ref >39)
LDL Chol Calc (NIH): 174 mg/dL — ABNORMAL HIGH (ref 0–99)
Triglycerides: 82 mg/dL (ref 0–149)
VLDL Cholesterol Cal: 14 mg/dL (ref 5–40)

## 2024-01-30 ENCOUNTER — Ambulatory Visit: Payer: Self-pay | Admitting: Family Medicine

## 2024-01-31 NOTE — Telephone Encounter (Signed)
 Patient aware and verbalized understanding.  Reports he had not been taking but will pick up from pharmacy and start taking.

## 2024-02-28 ENCOUNTER — Encounter: Payer: Self-pay | Admitting: Family Medicine

## 2024-02-28 ENCOUNTER — Ambulatory Visit (INDEPENDENT_AMBULATORY_CARE_PROVIDER_SITE_OTHER): Admitting: Family Medicine

## 2024-02-28 VITALS — BP 115/66 | HR 67 | Temp 98.0°F | Ht 67.0 in | Wt 174.0 lb

## 2024-02-28 DIAGNOSIS — R351 Nocturia: Secondary | ICD-10-CM

## 2024-02-28 DIAGNOSIS — I471 Supraventricular tachycardia, unspecified: Secondary | ICD-10-CM | POA: Diagnosis not present

## 2024-02-28 DIAGNOSIS — I10 Essential (primary) hypertension: Secondary | ICD-10-CM | POA: Diagnosis not present

## 2024-02-28 DIAGNOSIS — N401 Enlarged prostate with lower urinary tract symptoms: Secondary | ICD-10-CM

## 2024-02-28 NOTE — Progress Notes (Signed)
 Subjective:  Patient ID: Aaron Whitney, male    DOB: 03/26/1936  Age: 88 y.o. MRN: [id number]  CC: Medical Management of Chronic Issues (1 month follow up)   HPI  Discussed the use of AI scribe software for clinical note transcription with the patient, who gave verbal consent to proceed.  History of Present Illness Aaron Whitney is an 88 year old male with cardiac arrhythmia who presents with concerns about heart palpitations and nocturia.  He experiences irregular heartbeats, described as 'quick beats' occurring every six to ten normal beats. He is concerned about the frequency of these palpitations and mentions that they can be heard during auscultation.  He experiences significant nocturia, waking up three times a night to urinate, with a substantial volume of urine, measuring up to two liters in one night. During the day, he urinates less frequently. He is currently taking silodosin  and finasteride  for his prostate, which he believes are effective in helping him pass urine.  He has been using furosemide  (Lasix ) to manage fluid retention, initially taking a full tablet but then reducing to a half tablet. He does not observe any swelling in his feet or ankles and is concerned about the potential for dehydration from the diuretic.  He reports significant weight loss and muscle mass reduction, accompanied by weakness. He is concerned about the impact on his bone structure, noting 'snapping and popping' sounds in his back and other areas when bending over. He has resumed taking Osteo Bi-Flex after a period of discontinuation.          02/28/2024    2:30 PM 01/26/2024   11:23 AM 12/22/2023   11:15 AM  Depression screen PHQ 2/9  Decreased Interest 0 0 0  Down, Depressed, Hopeless 0 0 0  PHQ - 2 Score 0 0 0  Altered sleeping   0  Tired, decreased energy   2  Change in appetite   0  Feeling bad or failure about yourself    0  Trouble concentrating   0  Moving  slowly or fidgety/restless   0  Suicidal thoughts   0  PHQ-9 Score   2  Difficult doing work/chores   Not difficult at all    History Aaron Whitney has a past medical history of Arthritis, Cancer (HCC) (06/16/2011), Hypertension, and Macular degeneration.   He has a past surgical history that includes Prostate surgery.   His family history includes Alcohol abuse in his father; Cancer in his mother.He reports that he has never smoked. He has never used smokeless tobacco. He reports that he does not drink alcohol and does not use drugs.    ROS Review of Systems  Constitutional:  Negative for fever.  Respiratory:  Negative for shortness of breath.   Cardiovascular:  Negative for chest pain.  Musculoskeletal:  Negative for arthralgias.  Skin:  Negative for rash.    Objective:  BP 115/66   Pulse 67   Temp 98 F (36.7 C)   Ht 5' 7 (1.702 m)   Wt 174 lb (78.9 kg)   SpO2 93%   BMI 27.25 kg/m   BP Readings from Last 3 Encounters:  02/28/24 115/66  01/26/24 (!) 106/57  12/22/23 122/68    Wt Readings from Last 3 Encounters:  02/28/24 174 lb (78.9 kg)  01/26/24 173 lb 3.2 oz (78.6 kg)  12/22/23 171 lb (77.6 kg)     Physical Exam Vitals reviewed.  Constitutional:      General: He is  in acute distress.     Appearance: He is well-developed. He is ill-appearing.  HENT:     Head: Normocephalic and atraumatic.     Right Ear: External ear normal.     Left Ear: External ear normal.     Mouth/Throat:     Pharynx: No oropharyngeal exudate or posterior oropharyngeal erythema.  Eyes:     Pupils: Pupils are equal, round, and reactive to light.  Cardiovascular:     Rate and Rhythm: Normal rate and regular rhythm.     Heart sounds: No murmur heard. Pulmonary:     Effort: No respiratory distress.     Breath sounds: Normal breath sounds.  Musculoskeletal:     Cervical back: Normal range of motion and neck supple.  Neurological:     Mental Status: He is alert and oriented to  person, place, and time.      Assessment & Plan:  Benign prostatic hyperplasia with nocturia  Essential hypertension -     EKG 12-Lead  SVT (supraventricular tachycardia) (HCC) -     EKG 12-Lead    Assessment and Plan Assessment & Plan Benign prostatic hyperplasia with nocturia   He reports nocturia with significant urine output at night, measuring up to two liters. Silodosin  and Finasteride  are effective in aiding urine passage. Furosemide  may contribute to increased nocturnal urine production. Discontinue furosemide  to assess its impact on nocturia. Continue Silodosin  and Finasteride  for BPH management.  Supraventricular tachycardia   He experiences frequent quick heartbeats every 6-10 beats. Carvedilol  12.5 mg is not effectively controlling these episodes. An EKG is planned to further evaluate the arrhythmia. Perform EKG to evaluate heart rhythm and review results to determine further management.  Muscle weakness and loss of muscle mass   He reports significant weight loss, muscle weakness, and loss of muscle mass, with associated joint noises. He is concerned about the impact on bone structure and has resumed taking Osteo Bi-Flex. He acknowledges difficulty in addressing muscle loss in advanced age. Review current medications for potential contribution to muscle weakness. Encourage regular exercise as tolerated and continue Osteo Bi-Flex supplementation.     Needs to see Dr. Saturnino, urology. 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 Prefer not to further control rate. Palpitations adequately suppressed with carvedilol .  Follow-up: Return in about 1 month (around 03/29/2024).  Aaron Whitney, M.D.

## 2024-03-22 ENCOUNTER — Ambulatory Visit

## 2024-03-22 VITALS — BP 115/66 | HR 67 | Ht 67.0 in | Wt 174.0 lb

## 2024-03-22 DIAGNOSIS — Z0000 Encounter for general adult medical examination without abnormal findings: Secondary | ICD-10-CM

## 2024-03-22 NOTE — Progress Notes (Signed)
 Subjective:   Aaron Whitney is a 88 y.o. who presents for a Medicare Wellness preventive visit.  As a reminder, Annual Wellness Visits don't include a physical exam, and some assessments may be limited, especially if this visit is performed virtually. We may recommend an in-person follow-up visit with your provider if needed.  Visit Complete: Virtual I connected with  Aaron Whitney on 03/22/24 by a audio enabled telemedicine application and verified that I am speaking with the correct person using two identifiers.  Patient Location: Home  Provider Location: Home Office  I discussed the limitations of evaluation and management by telemedicine. The patient expressed understanding and agreed to proceed.  Vital Signs: Because this visit was a virtual/telehealth visit, some criteria may be missing or patient reported. Any vitals not documented were not able to be obtained and vitals that have been documented are patient reported.  VideoDeclined- This patient declined Librarian, academic. Therefore the visit was completed with audio only.  Persons Participating in Visit: Patient.  AWV Questionnaire: No: Patient Medicare AWV questionnaire was not completed prior to this visit.  Cardiac Risk Factors include: advanced age (>106men, >90 women);dyslipidemia;male gender     Objective:    Today's Vitals   03/22/24 1036  BP: 115/66  Pulse: 67  Weight: 174 lb (78.9 kg)  Height: 5' 7 (1.702 m)   Body mass index is 27.25 kg/m.     03/22/2024   10:32 AM 12/09/2022    2:15 PM 12/02/2021    2:54 PM 11/21/2020    2:00 PM 11/21/2019    1:23 PM  Advanced Directives  Does Patient Have a Medical Advance Directive? Yes Yes Yes Yes Yes  Type of Estate agent of [place];Living will Healthcare Power of [place];Living will Healthcare Power of [place];Living will Healthcare Power of [place];Living will Healthcare Power of [place];Living  will  Does patient want to make changes to medical advance directive?     No - Patient declined  Copy of Healthcare Power of Attorney in Chart? Yes - validated most recent copy scanned in chart (See row information) No - copy requested No - copy requested No - copy requested   Would patient like information on creating a medical advance directive?     No - Patient declined    Current Medications (verified) Outpatient Encounter Medications as of 03/22/2024  Medication Sig   amLODipine  (NORVASC ) 10 MG tablet Take 1 tablet (10 mg total) by mouth daily.   Bempedoic Acid-Ezetimibe  (NEXLIZET ) 180-10 MG TABS Take 1 tablet by mouth daily.   carvedilol  (COREG ) 12.5 MG tablet Take 1 tablet (12.5 mg total) by mouth 2 (two) times daily with a meal.   finasteride  (PROSCAR ) 5 MG tablet Take 1 tablet (5 mg total) by mouth daily. For urine flow   fluticasone  (FLONASE ) 50 MCG/ACT nasal spray Place 2 sprays into both nostrils daily.   Misc Natural Products (OSTEO BI-FLEX TRIPLE STRENGTH) TABS Take 1 tablet by mouth 2 (two) times daily.   rosuvastatin  (CRESTOR ) 5 MG tablet Take 1 tablet (5 mg total) by mouth daily. For cholesterol   silodosin  (RAPAFLO ) 8 MG CAPS capsule Take 1 capsule (8 mg total) by mouth daily. For urine flow (prostate)   traZODone  (DESYREL ) 50 MG tablet Take 1 tablet (50 mg total) by mouth at bedtime.   No facility-administered encounter medications on file as of 03/22/2024.    Allergies (verified) Patient has no known allergies.   History: Past Medical History:  Diagnosis  Date   Arthritis    Cancer (HCC) 06/16/2011   prostate   Hypertension    Macular degeneration    Past Surgical History:  Procedure Laterality Date   PROSTATE SURGERY     Family History  Problem Relation Age of Onset   Cancer Mother    Alcohol abuse Father    Social History   Socioeconomic History   Marital status: Married    Spouse name: Tressie   Number of children: 4   Years of education: Not on file    Highest education level: Bachelor's degree (e.g., BA, AB, BS)  Occupational History   Occupation: retired  Tobacco Use   Smoking status: Never   Smokeless tobacco: Never  Substance and Sexual Activity   Alcohol use: No    Alcohol/week: 0.0 standard drinks of alcohol   Drug use: No   Sexual activity: Not on file  Other Topics Concern   Not on file  Social History Narrative   Lives at home with wife. Family lives nearby.    Stays active, exercising 3-4 times per week. Actively involved in church.   Hx of prostate cancer - seeing Dr Saturnino for routine f/u   Social Drivers of Health   Financial Resource Strain: Low Risk  (03/22/2024)   Overall Financial Resource Strain (CARDIA)    Difficulty of Paying Living Expenses: Not hard at all  Food Insecurity: No Food Insecurity (03/22/2024)   Hunger Vital Sign    Worried About Running Out of Food in the Last Year: Never true    Ran Out of Food in the Last Year: Never true  Transportation Needs: No Transportation Needs (03/22/2024)   PRAPARE - Administrator, Civil Service (Medical): No    Lack of Transportation (Non-Medical): No  Physical Activity: Sufficiently Active (03/22/2024)   Exercise Vital Sign    Days of Exercise per Week: 3 days    Minutes of Exercise per Session: 60 min  Stress: No Stress Concern Present (03/22/2024)   [employer] of Occupational Health - Occupational Stress Questionnaire    Feeling of Stress: Not at all  Social Connections: Socially Integrated (03/22/2024)   Social Connection and Isolation Panel    Frequency of Communication with Friends and Family: More than three times a week    Frequency of Social Gatherings with Friends and Family: More than three times a week    Attends Religious Services: More than 4 times per year    Active Member of [employer] or Organizations: Yes    Attends Engineer, structural: More than 4 times per year    Marital Status: Married    Tobacco  Counseling Counseling given: Yes    Clinical Intake:  Pre-visit preparation completed: Yes  Pain : No/denies pain     BMI - recorded: 27.25 Nutritional Status: BMI 25 -29 Overweight Nutritional Risks: None Diabetes: No  No results found for: HGBA1C   How often do you need to have someone help you when you read instructions, pamphlets, or other written materials from your doctor or pharmacy?: 1 - Never  Interpreter Needed?: No  Information entered by :: alia t/cma   Activities of Daily Living     03/22/2024   10:31 AM  In your present state of health, do you have any difficulty performing the following activities:  Hearing? 0  Vision? 0  Difficulty concentrating or making decisions? 0  Walking or climbing stairs? 0  Dressing or bathing? 0  Doing errands, shopping?  0  Preparing Food and eating ? N  Using the Toilet? N  In the past six months, have you accidently leaked urine? N  Do you have problems with loss of bowel control? N  Managing your Medications? N  Managing your Finances? N  Housekeeping or managing your Housekeeping? N    Patient Care Team: Zollie Lowers, MD as PCP - General (Family Medicine) Jeffrie Oneil BROCKS, MD as PCP - Cardiology (Cardiology) Saturnino Marzetta Blumenthal, MD as Referring Physician (Urology)  I have updated your Care Teams any recent Medical Services you may have received from other providers in the past year.     Assessment:   This is a routine wellness examination for Aaron Whitney.  Hearing/Vision screen Hearing Screening - Comments:: Pt denies hearing dif Vision Screening - Comments:: Pt wear glasses/pt goes Dr. Yvonna in [place], Last ov over a yr ago   Goals Addressed             This Visit's Progress    Exercise 3x per week (30 min per time)   On track      Depression Screen     03/22/2024   10:33 AM 02/28/2024    2:30 PM 01/26/2024   11:23 AM 12/22/2023   11:15 AM 09/21/2023   12:22 PM 06/23/2023   10:09 AM  03/22/2023    9:34 AM  PHQ 2/9 Scores  PHQ - 2 Score 0 0 0 0 0 0 0  PHQ- 9 Score    2 0      Fall Risk     03/22/2024   10:29 AM 02/28/2024    2:30 PM 01/26/2024   11:23 AM 03/22/2023    9:34 AM 12/09/2022    2:13 PM  Fall Risk   Falls in the past year? 0 0 0 0 0  Number falls in past yr: 0 0   0  Injury with Fall? 0 0   0  Risk for fall due to : No Fall Risks No Fall Risks   No Fall Risks  Follow up Falls evaluation completed Falls evaluation completed   Falls prevention discussed    MEDICARE RISK AT HOME:  Medicare Risk at Home Any stairs in or around the home?: No If so, are there any without handrails?: No Home free of loose throw rugs in walkways, pet beds, electrical cords, etc?: Yes Adequate lighting in your home to reduce risk of falls?: Yes Life alert?: No Use of a cane, walker or w/c?: No Grab bars in the bathroom?: No Shower chair or bench in shower?: No Elevated toilet seat or a handicapped toilet?: No  TIMED UP AND GO:  Was the test performed?  No  Cognitive Function: 6CIT completed        12/09/2022    2:16 PM 12/02/2021    2:55 PM 11/21/2020    1:38 PM 11/21/2019    1:27 PM  6CIT Screen  What Year? 0 points 0 points 0 points 0 points  What month? 0 points 0 points 0 points 0 points  What time? 0 points 0 points 0 points 0 points  Count back from 20 0 points 0 points 0 points 0 points  Months in reverse 0 points 0 points 0 points 0 points  Repeat phrase 0 points 6 points 0 points 2 points  Total Score 0 points 6 points 0 points 2 points    Immunizations Immunization History  Administered Date(s) Administered   Janssen (J&J) SARS-COV-2 Vaccination 08/27/2019  PFIZER(Purple Top)SARS-COV-2 Vaccination 06/17/2020   Pneumococcal Conjugate-13 09/04/2014, 11/19/2020   Pneumococcal Polysaccharide-23 12/13/2012, 10/05/2017   Tdap 12/21/2012    Screening Tests Health Maintenance  Topic Date Due   COVID-19 Vaccine (3 - 2025-26 season) 02/14/2024   Zoster  Vaccines- Shingrix (1 of 2) 03/23/2024 (Originally 10/02/1954)   DTaP/Tdap/Td (2 - Td or Tdap) 06/22/2024 (Originally 12/22/2022)   Influenza Vaccine  09/12/2024 (Originally 01/14/2024)   Medicare Annual Wellness (AWV)  03/22/2025   Pneumococcal Vaccine: 50+ Years  Completed   Meningococcal B Vaccine  Aged Out    Health Maintenance Items Addressed: See Nurse Notes at the end of this note  Additional Screening:  Vision Screening: Recommended annual ophthalmology exams for early detection of glaucoma and other disorders of the eye. Is the patient up to date with their annual eye exam?  Yes  Who is the provider or what is the name of the office in which the patient attends annual eye exams? Dr. Yvonna in [place], [place]  Dental Screening: Recommended annual dental exams for proper oral hygiene  Community Resource Referral / Chronic Care Management: CRR required this visit?  No   CCM required this visit?  No   Plan:    I have personally reviewed and noted the following in the patient's chart:   Medical and social history Use of alcohol, tobacco or illicit drugs  Current medications and supplements including opioid prescriptions. Patient is not currently taking opioid prescriptions. Functional ability and status Nutritional status Physical activity Advanced directives List of other physicians Hospitalizations, surgeries, and ER visits in previous 12 months Vitals Screenings to include cognitive, depression, and falls Referrals and appointments  In addition, I have reviewed and discussed with patient certain preventive protocols, quality metrics, and best practice recommendations. A written personalized care plan for preventive services as well as general preventive health recommendations were provided to patient.   Aaron Whitney, CMA   03/22/2024   After Visit Summary: (MyChart) Due to this being a telephonic visit, the after visit summary with patients personalized plan was  offered to patient via MyChart   Notes: Nothing significant to report at this time.

## 2024-03-22 NOTE — Patient Instructions (Signed)
 Aaron Whitney,  Thank you for taking the time for your Medicare Wellness Visit. I appreciate your continued commitment to your health goals. Please review the care plan we discussed, and feel free to reach out if I can assist you further.  Medicare recommends these wellness visits once per year to help you and your care team stay ahead of potential health issues. These visits are designed to focus on prevention, allowing your provider to concentrate on managing your acute and chronic conditions during your regular appointments.  Please note that Annual Wellness Visits do not include a physical exam. Some assessments may be limited, especially if the visit was conducted virtually. If needed, we may recommend a separate in-person follow-up with your provider.  Ongoing Care Seeing your primary care provider every 3 to 6 months helps us  monitor your health and provide consistent, personalized care.   Referrals If a referral was made during today's visit and you haven't received any updates within two weeks, please contact the referred provider directly to check on the status.  Recommended Screenings:  Health Maintenance  Topic Date Due   Medicare Annual Wellness Visit  12/09/2023   COVID-19 Vaccine (3 - 2025-26 season) 02/14/2024   Zoster (Shingles) Vaccine (1 of 2) 03/23/2024*   DTaP/Tdap/Td vaccine (2 - Td or Tdap) 06/22/2024*   Flu Shot  09/12/2024*   Pneumococcal Vaccine for age over 68  Completed   Meningitis B Vaccine  Aged Out  *Topic was postponed. The date shown is not the original due date.       03/22/2024   10:32 AM  Advanced Directives  Does Patient Have a Medical Advance Directive? Yes  Type of Estate agent of [place];Living will  Copy of Healthcare Power of Attorney in Chart? Yes - validated most recent copy scanned in chart (See row information)   Advance Care Planning is important because it: Ensures you receive medical care that aligns with  your values, goals, and preferences. Provides guidance to your family and loved ones, reducing the emotional burden of decision-making during critical moments.  Vision: Annual vision screenings are recommended for early detection of glaucoma, cataracts, and diabetic retinopathy. These exams can also reveal signs of chronic conditions such as diabetes and high blood pressure.  Dental: Annual dental screenings help detect early signs of oral cancer, gum disease, and other conditions linked to overall health, including heart disease and diabetes.  Please see the attached documents for additional preventive care recommendations.

## 2024-03-29 ENCOUNTER — Encounter: Payer: Self-pay | Admitting: Family Medicine

## 2024-03-29 ENCOUNTER — Ambulatory Visit: Admitting: Family Medicine

## 2024-03-29 VITALS — BP 125/71 | HR 60 | Temp 98.0°F | Ht 67.0 in | Wt 176.0 lb

## 2024-03-29 DIAGNOSIS — N401 Enlarged prostate with lower urinary tract symptoms: Secondary | ICD-10-CM | POA: Diagnosis not present

## 2024-03-29 DIAGNOSIS — R351 Nocturia: Secondary | ICD-10-CM

## 2024-03-29 DIAGNOSIS — M4716 Other spondylosis with myelopathy, lumbar region: Secondary | ICD-10-CM

## 2024-03-29 DIAGNOSIS — E782 Mixed hyperlipidemia: Secondary | ICD-10-CM

## 2024-03-29 DIAGNOSIS — I471 Supraventricular tachycardia, unspecified: Secondary | ICD-10-CM

## 2024-03-29 DIAGNOSIS — I10 Essential (primary) hypertension: Secondary | ICD-10-CM | POA: Diagnosis not present

## 2024-03-29 DIAGNOSIS — M6281 Muscle weakness (generalized): Secondary | ICD-10-CM

## 2024-03-29 NOTE — Progress Notes (Signed)
 Subjective:  Patient ID: Aaron Whitney, male    DOB: 1935-08-16  Age: 88 y.o. MRN: [id number]  CC: Medical Management of Chronic Issues   HPI  Discussed the use of AI scribe software for clinical note transcription with the patient, who gave verbal consent to proceed.  History of Present Illness Aaron Whitney is an 88 year old male who presents for follow-up of multiple chronic conditions including muscle loss, arthritis, and heart palpitations.  He has a bruise on his left forearm, approximately three inches by an inch and a half, which bled longer under the skin than usual. He was taking an adult aspirin.  He is experiencing muscle loss and is concerned about the impact of his cholesterol medication, Crestor , on his muscle mass. Previous testing did not reveal any medical cause for the muscle loss. He has also gained weight, increasing from 174 to 176 pounds since his last visit.  He describes snapping, cracking, and popping sounds in his back and neck, which he attributes to arthritis. There is no associated pain with these sounds. He also reports shoulder pain when raising his arm, which he attributes to arthritis. He is currently taking Osteo Bi-Flex.  He continues to experience nocturia, getting up two to three times a night, despite stopping Lasix . No issues with urinary incontinence.  He experiences occasional heart palpitations, describing them as a quick beat occurring one to two times in ten to twenty beats. No fainting or syncope during these episodes.          03/29/2024   10:56 AM 03/22/2024   10:33 AM 02/28/2024    2:30 PM  Depression screen PHQ 2/9  Decreased Interest 0 0 0  Down, Depressed, Hopeless 0 0 0  PHQ - 2 Score 0 0 0    History Dionel has a past medical history of Arthritis, Cancer (HCC) (06/16/2011), Hypertension, and Macular degeneration.   He has a past surgical history that includes Prostate surgery.   His family  history includes Alcohol abuse in his father; Cancer in his mother.He reports that he has never smoked. He has never used smokeless tobacco. He reports that he does not drink alcohol and does not use drugs.    ROS Review of Systems  Constitutional:  Negative for fever.  Respiratory:  Negative for shortness of breath.   Cardiovascular:  Negative for chest pain.  Musculoskeletal:  Positive for arthralgias.  Skin:  Negative for rash.    Objective:  BP 125/71   Pulse 60   Temp 98 F (36.7 C)   Ht 5' 7 (1.702 m)   Wt 176 lb (79.8 kg)   SpO2 94%   BMI 27.57 kg/m   BP Readings from Last 3 Encounters:  03/29/24 125/71  03/22/24 115/66  02/28/24 115/66    Wt Readings from Last 3 Encounters:  03/29/24 176 lb (79.8 kg)  03/22/24 174 lb (78.9 kg)  02/28/24 174 lb (78.9 kg)     Physical Exam Physical Exam VITALS: P- 60 MEASUREMENTS: Weight- 176. GENERAL: Alert, cooperative, well developed, no acute distress. HEENT: Normocephalic, normal oropharynx, moist mucous membranes. CHEST: Clear to auscultation bilaterally. No wheezes, rhonchi, or crackles. CARDIOVASCULAR: Normal heart rate and rhythm. S1 and S2 normal without murmurs. ABDOMEN: Soft, non-tender, non-distended, without organomegaly. Normal bowel sounds. EXTREMITIES: No cyanosis or edema. NEUROLOGICAL: Cranial nerves grossly intact. Moves all extremities without gross motor or sensory deficit. SKIN: Bruise on left forearm, 3 inches by 1.5 inches.   Assessment &  Plan:  Benign prostatic hyperplasia with nocturia  Mixed hyperlipidemia  Essential hypertension  Lumbar spondylosis with myelopathy  SVT (supraventricular tachycardia)  Muscle weakness    Assessment and Plan Assessment & Plan Benign prostatic hyperplasia with nocturia   He continues to experience nocturia, waking 2-3 times per night despite stopping furosemide . There is no incontinence. A discussion with urologist Dr. Saturnino about potential  procedural intervention is pending. He is on two potent medications, and a procedure may be the next step to alleviate symptoms. Contact Dr. Saturnino regarding prostate and urine flow issues and facilitate a referral if needed.  Cardiac arrhythmia (including supraventricular tachycardia and PACs)   He experiences occasional PACs without significant symptoms like syncope. His heart rate is 60 bpm. Current management with carvedilol  is adequate, and there is no immediate need for a pacemaker. Increasing medication is a last resort due to potential pacemaker requirement. Continue carvedilol  12.5 mg as prescribed and monitor for symptoms such as syncope or significant palpitations.  Arthritis of spine and shoulder   He experiences snapping, cracking, and popping in the spine and shoulder, likely due to arthritis, but without pain. Shoulder pain occurs when lifting the arm, likely due to arthritis. He is taking Osteo Bi-Flex. The snapping and cracking are described as a bowstringing effect and are harmless unless pain is present. Continue Osteo Bi-Flex and consider adding two extra strength Tylenol three times a day if additional pain management is needed.  Muscle weakness and loss of muscle mass   There is ongoing concern about muscle mass loss, likely age-related as previous tests have not indicated any other medical cause. His weight has increased slightly from 174 to 176 pounds, which is not concerning. Regular exercise is recommended to slow down age-related muscle decline. Encourage regular exercise to slow down age-related muscle decline.  General Health Maintenance   There was a discussion about reducing aspirin dosage to minimize bruising. Current cholesterol management with Crestor  is well tolerated. He was advised to switch from an adult aspirin to a baby aspirin to reduce bruising risk. Reduce aspirin to baby aspirin to minimize bruising and continue Crestor  for cholesterol management.        Follow-up: Return in about 3 months (around 06/29/2024).  Butler Der, M.D.

## 2024-04-07 ENCOUNTER — Encounter: Payer: Self-pay | Admitting: *Deleted

## 2024-05-06 ENCOUNTER — Other Ambulatory Visit: Payer: Self-pay | Admitting: Family Medicine

## 2024-06-26 ENCOUNTER — Other Ambulatory Visit: Payer: Self-pay | Admitting: Family Medicine

## 2024-06-26 DIAGNOSIS — I10 Essential (primary) hypertension: Secondary | ICD-10-CM

## 2024-06-29 ENCOUNTER — Ambulatory Visit: Admitting: Family Medicine

## 2024-07-03 ENCOUNTER — Encounter: Payer: Self-pay | Admitting: Family Medicine

## 2024-07-03 ENCOUNTER — Ambulatory Visit (INDEPENDENT_AMBULATORY_CARE_PROVIDER_SITE_OTHER): Payer: Self-pay | Admitting: Family Medicine

## 2024-07-03 VITALS — BP 119/67 | HR 68 | Temp 97.8°F | Ht 67.0 in | Wt 183.0 lb

## 2024-07-03 DIAGNOSIS — E782 Mixed hyperlipidemia: Secondary | ICD-10-CM | POA: Diagnosis not present

## 2024-07-03 DIAGNOSIS — M150 Primary generalized (osteo)arthritis: Secondary | ICD-10-CM | POA: Diagnosis not present

## 2024-07-03 DIAGNOSIS — I10 Essential (primary) hypertension: Secondary | ICD-10-CM | POA: Diagnosis not present

## 2024-07-03 DIAGNOSIS — R351 Nocturia: Secondary | ICD-10-CM

## 2024-07-03 DIAGNOSIS — N401 Enlarged prostate with lower urinary tract symptoms: Secondary | ICD-10-CM

## 2024-07-03 DIAGNOSIS — I471 Supraventricular tachycardia, unspecified: Secondary | ICD-10-CM

## 2024-07-03 LAB — LIPID PANEL

## 2024-07-03 MED ORDER — AMLODIPINE BESYLATE 10 MG PO TABS: 10.0000 mg | ORAL_TABLET | Freq: Every day | ORAL | 3 refills | Status: AC

## 2024-07-03 NOTE — Progress Notes (Signed)
 "  Subjective:  Patient ID: Aaron Whitney, male    DOB: 1936-04-11  Age: 89 y.o. MRN: [id number]  CC: Medical Management of Chronic Issues (Pt states that he saw you a year ago because his heart was skipping a beat. No chest pain. Carvedilol  has not helped with this. ) and muscle mass (Losing muscle mass and strength. A lot of cracking and popping in bones. No pain but pt feels weak. Ongoing for at least 6 months. )   HPI  Discussed the use of AI scribe software for clinical note transcription with the patient, who gave verbal consent to proceed.  History of Present Illness Aaron Whitney is an 89 year old male who presents with sleep disturbances and irregular heartbeats.  He experiences sleep disturbances, partly attributed to prostate issues. Trazodone  has been beneficial, allowing nights with only one episode of nocturia, though he occasionally wakes up five times. He is currently taking silodosin  (Rapaflo ) for urinary symptoms related to his prostate.  He switched from Nexlizet  to Crestor  for cholesterol management a few months ago and takes it daily. No significant increase in muscle or joint pain since starting Crestor , attributing any pain to arthritis.  He experiences irregular heartbeats, described as occasional quick beats followed by regular beats, without associated dizziness or lightheadedness. He is cautious about his balance but has not experienced any falls.  He manages pain with Tylenol, taking two pills three times a day, which he finds somewhat helpful. He exercises three times a week but notes a loss of strength over time, attributing it to aging and arthritis. No dizziness or lightheadedness associated with his irregular heartbeats. He reports muscle and joint pain but does not attribute it to Crestor  use.          07/03/2024   11:35 AM 03/29/2024   10:56 AM 03/22/2024   10:33 AM  Depression screen PHQ 2/9  Decreased Interest 0 0 0  Down,  Depressed, Hopeless 0 0 0  PHQ - 2 Score 0 0 0  Altered sleeping 0    Tired, decreased energy 0    Change in appetite 0    Feeling bad or failure about yourself  0    Trouble concentrating 0    Moving slowly or fidgety/restless 0    Suicidal thoughts 0    PHQ-9 Score 0    Difficult doing work/chores Not difficult at all      History Aaron Whitney has a past medical history of Arthritis, Cancer (HCC) (06/16/2011), Hypertension, and Macular degeneration.   He has a past surgical history that includes Prostate surgery.   His family history includes Alcohol abuse in his father; Cancer in his mother.He reports that he has never smoked. He has never used smokeless tobacco. He reports that he does not drink alcohol and does not use drugs.    ROS Review of Systems  Constitutional: Negative.   HENT: Negative.    Eyes:  Negative for visual disturbance.  Respiratory:  Negative for cough and shortness of breath.   Cardiovascular:  Negative for chest pain and leg swelling.  Gastrointestinal:  Negative for abdominal pain, diarrhea, nausea and vomiting.  Genitourinary:  Negative for difficulty urinating.  Musculoskeletal:  Negative for arthralgias and myalgias.  Skin:  Negative for rash.  Neurological:  Negative for headaches.  Psychiatric/Behavioral:  Negative for sleep disturbance.     Objective:  BP 119/67   Pulse 68   Temp 97.8 F (36.6 C)   Ht 5' 7 (  1.702 m)   Wt 183 lb (83 kg)   SpO2 94%   BMI 28.66 kg/m   BP Readings from Last 3 Encounters:  07/03/24 119/67  03/29/24 125/71  03/22/24 115/66    Wt Readings from Last 3 Encounters:  07/03/24 183 lb (83 kg)  03/29/24 176 lb (79.8 kg)  03/22/24 174 lb (78.9 kg)     Physical Exam Physical Exam VITALS: BP- 119/67 GENERAL: Alert, cooperative, well developed, no acute distress. HEENT: Normocephalic, normal oropharynx, moist mucous membranes. CHEST: Clear to auscultation bilaterally, no wheezes, rhonchi, or  crackles. CARDIOVASCULAR: Normal heart rate and rhythm, S1 and S2 normal without murmurs. ABDOMEN: Soft, non-tender, non-distended, without organomegaly, normal bowel sounds. EXTREMITIES: No cyanosis or edema. NEUROLOGICAL: Cranial nerves grossly intact, moves all extremities without gross motor or sensory deficit.   Assessment & Plan:  There are no diagnoses linked to this encounter.  Assessment and Plan Assessment & Plan Benign prostatic hyperplasia with nocturia   Chronic benign prostatic hyperplasia with nocturia is managed with Saleto for urinary symptoms. Trazodone  has improved sleep quality, reducing nocturnal awakenings. Continue Saleto.  Mixed hyperlipidemia   Crestor  is managing mixed hyperlipidemia effectively, with no significant muscle aches or body pains reported. Cholesterol levels require re-evaluation, so a cholesterol blood test has been ordered. Continue Crestor  as prescribed.  Essential hypertension   Blood pressure is well-controlled at 119/67 mmHg, with no dizziness or lightheadedness. Balance issues are likely age-related and not linked to blood pressure medication. Continue the current blood pressure management regimen.  SVT (supraventricular tachycardia)   Intermittent premature beats are noted, but the overall heart rhythm is stable. There are no symptoms of dizziness or lightheadedness, and the EKG from September shows no significant issues. Premature beats are considered harmless and do not require treatment.  Primary generalized osteoarthritis   Chronic osteoarthritis causes joint pain in shoulders, back, and arms, managed with Tylenol. There are no significant changes in pain levels since starting Crestor . Continue Tylenol as needed for pain management and encourage regular exercise, including walking and light weightlifting.       Follow-up: No follow-ups on file.  Butler Der, M.D. "

## 2024-07-04 LAB — CBC WITH DIFFERENTIAL/PLATELET
Basophils Absolute: 0 x10E3/uL (ref 0.0–0.2)
Basos: 0 %
EOS (ABSOLUTE): 0.2 x10E3/uL (ref 0.0–0.4)
Eos: 2 %
Hematocrit: 43.5 % (ref 37.5–51.0)
Hemoglobin: 14.5 g/dL (ref 13.0–17.7)
Immature Grans (Abs): 0.1 x10E3/uL (ref 0.0–0.1)
Immature Granulocytes: 1 %
Lymphocytes Absolute: 1.8 x10E3/uL (ref 0.7–3.1)
Lymphs: 20 %
MCH: 31.1 pg (ref 26.6–33.0)
MCHC: 33.3 g/dL (ref 31.5–35.7)
MCV: 93 fL (ref 79–97)
Monocytes Absolute: 0.9 x10E3/uL (ref 0.1–0.9)
Monocytes: 10 %
Neutrophils Absolute: 6.2 x10E3/uL (ref 1.4–7.0)
Neutrophils: 67 %
Platelets: 246 x10E3/uL (ref 150–450)
RBC: 4.66 x10E6/uL (ref 4.14–5.80)
RDW: 13.3 % (ref 11.6–15.4)
WBC: 9.1 x10E3/uL (ref 3.4–10.8)

## 2024-07-04 LAB — CMP14+EGFR
ALT: 19 IU/L (ref 0–44)
AST: 29 IU/L (ref 0–40)
Albumin: 4.6 g/dL (ref 3.7–4.7)
Alkaline Phosphatase: 67 IU/L (ref 48–129)
BUN/Creatinine Ratio: 27 — AB (ref 10–24)
BUN: 21 mg/dL (ref 8–27)
Bilirubin Total: 0.8 mg/dL (ref 0.0–1.2)
CO2: 21 mmol/L (ref 20–29)
Calcium: 9.7 mg/dL (ref 8.6–10.2)
Chloride: 98 mmol/L (ref 96–106)
Creatinine, Ser: 0.77 mg/dL (ref 0.76–1.27)
Globulin, Total: 2.9 g/dL (ref 1.5–4.5)
Glucose: 94 mg/dL (ref 70–99)
Potassium: 4.9 mmol/L (ref 3.5–5.2)
Sodium: 137 mmol/L (ref 134–144)
Total Protein: 7.5 g/dL (ref 6.0–8.5)
eGFR: 87 mL/min/1.73

## 2024-07-04 LAB — LIPID PANEL
Cholesterol, Total: 180 mg/dL (ref 100–199)
HDL: 53 mg/dL
LDL CALC COMMENT:: 3.4 ratio (ref 0.0–5.0)
LDL Chol Calc (NIH): 105 mg/dL — AB (ref 0–99)
Triglycerides: 126 mg/dL (ref 0–149)
VLDL Cholesterol Cal: 22 mg/dL (ref 5–40)

## 2024-07-06 ENCOUNTER — Ambulatory Visit: Payer: Self-pay | Admitting: Family Medicine

## 2024-07-06 NOTE — Progress Notes (Signed)
Hello L[place]on,  Your lab result is normal and/or stable.Some minor variations that are not significant are commonly marked abnormal, but do not represent any medical problem for you.  Best regards, Mechele Claude, M.D.

## 2024-10-04 ENCOUNTER — Ambulatory Visit: Admitting: Family Medicine

## 2025-03-23 ENCOUNTER — Ambulatory Visit: Payer: Self-pay
